# Patient Record
Sex: Female | Born: 1955 | Race: White | Hispanic: No | Marital: Married | Smoking: Never smoker
Health system: Southern US, Community
[De-identification: ages and names within clinical notes are randomized; demographics above are authoritative.]

## PROBLEM LIST (undated history)

## (undated) DIAGNOSIS — N3281 Overactive bladder: Secondary | ICD-10-CM

## (undated) DIAGNOSIS — L570 Actinic keratosis: Secondary | ICD-10-CM

## (undated) DIAGNOSIS — F320 Major depressive disorder, single episode, mild: Secondary | ICD-10-CM

## (undated) DIAGNOSIS — E7800 Pure hypercholesterolemia, unspecified: Secondary | ICD-10-CM

## (undated) DIAGNOSIS — H409 Unspecified glaucoma: Secondary | ICD-10-CM

## (undated) DIAGNOSIS — C4491 Basal cell carcinoma of skin, unspecified: Secondary | ICD-10-CM

## (undated) DIAGNOSIS — M1990 Unspecified osteoarthritis, unspecified site: Secondary | ICD-10-CM

## (undated) DIAGNOSIS — F32A Depression, unspecified: Secondary | ICD-10-CM

## (undated) DIAGNOSIS — E0590 Thyrotoxicosis, unspecified without thyrotoxic crisis or storm: Secondary | ICD-10-CM

## (undated) DIAGNOSIS — Z9289 Personal history of other medical treatment: Secondary | ICD-10-CM

## (undated) DIAGNOSIS — R159 Full incontinence of feces: Secondary | ICD-10-CM

## (undated) HISTORY — PX: TUBAL LIGATION: SHX77

## (undated) HISTORY — DX: Overactive bladder: N32.81

## (undated) HISTORY — DX: Depression, unspecified: F32.A

## (undated) HISTORY — DX: Actinic keratosis: L57.0

## (undated) HISTORY — DX: Unspecified glaucoma: H40.9

## (undated) HISTORY — DX: Full incontinence of feces: R15.9

## (undated) HISTORY — DX: Major depressive disorder, single episode, mild: F32.0

## (undated) HISTORY — DX: Pure hypercholesterolemia, unspecified: E78.00

## (undated) HISTORY — PX: OTHER SURGICAL HISTORY: SHX169

## (undated) HISTORY — DX: Personal history of other medical treatment: Z92.89

## (undated) HISTORY — DX: Basal cell carcinoma of skin, unspecified: C44.91

## (undated) HISTORY — DX: Thyrotoxicosis, unspecified without thyrotoxic crisis or storm: E05.90

## (undated) HISTORY — DX: Unspecified osteoarthritis, unspecified site: M19.90

---

## 1988-08-23 DIAGNOSIS — E0590 Thyrotoxicosis, unspecified without thyrotoxic crisis or storm: Secondary | ICD-10-CM

## 1988-08-23 HISTORY — DX: Thyrotoxicosis, unspecified without thyrotoxic crisis or storm: E05.90

## 2004-10-21 ENCOUNTER — Ambulatory Visit: Payer: Self-pay

## 2005-12-01 ENCOUNTER — Ambulatory Visit: Payer: Self-pay

## 2007-02-07 ENCOUNTER — Ambulatory Visit: Payer: Self-pay

## 2008-02-14 ENCOUNTER — Ambulatory Visit: Payer: Self-pay

## 2008-08-23 HISTORY — PX: COLONOSCOPY: SHX174

## 2009-02-25 ENCOUNTER — Ambulatory Visit: Payer: Self-pay

## 2009-05-23 ENCOUNTER — Ambulatory Visit: Payer: Self-pay | Admitting: Gastroenterology

## 2009-08-23 HISTORY — PX: ESOPHAGOGASTRODUODENOSCOPY: SHX1529

## 2010-02-26 ENCOUNTER — Ambulatory Visit: Payer: Self-pay

## 2011-04-07 ENCOUNTER — Ambulatory Visit: Payer: Self-pay

## 2012-02-04 ENCOUNTER — Emergency Department: Payer: Self-pay | Admitting: Emergency Medicine

## 2012-02-04 LAB — COMPREHENSIVE METABOLIC PANEL
BUN: 17 mg/dL (ref 7–18)
Bilirubin,Total: 0.3 mg/dL (ref 0.2–1.0)
Calcium, Total: 9.1 mg/dL (ref 8.5–10.1)
Chloride: 106 mmol/L (ref 98–107)
Co2: 31 mmol/L (ref 21–32)
EGFR (Non-African Amer.): >60
Glucose: 115 mg/dL — ABNORMAL HIGH (ref 65–99)
Osmolality: 287 (ref 275–301)
Potassium: 3.7 mmol/L (ref 3.5–5.1)
SGOT(AST): 29 U/L (ref 15–37)
SGPT (ALT): 21 U/L
Sodium: 143 mmol/L (ref 136–145)
Total Protein: 6.9 g/dL (ref 6.4–8.2)

## 2012-02-04 LAB — CBC
HCT: 36.4 % (ref 35.0–47.0)
MCH: 30.7 pg (ref 26.0–34.0)
MCHC: 33.8 g/dL (ref 32.0–36.0)
MCV: 91 fL (ref 80–100)
Platelet: 198 10*3/uL (ref 150–440)
WBC: 5.6 10*3/uL (ref 3.6–11.0)

## 2012-02-04 LAB — PROTIME-INR: Prothrombin Time: 12.7 secs (ref 11.5–14.7)

## 2012-02-04 LAB — APTT: Activated PTT: 27 secs (ref 23.6–35.9)

## 2012-02-04 IMAGING — CR DG CHEST 2V
1 series · 2 of 2 positions shown · non-contrast
Comparison: none

REASON FOR EXAM: cp
COMMENTS:   May transport without cardiac monitor

[Series 1: pa · 0.17mm/px · 2 of 2 slices shown]
[im 1/2]
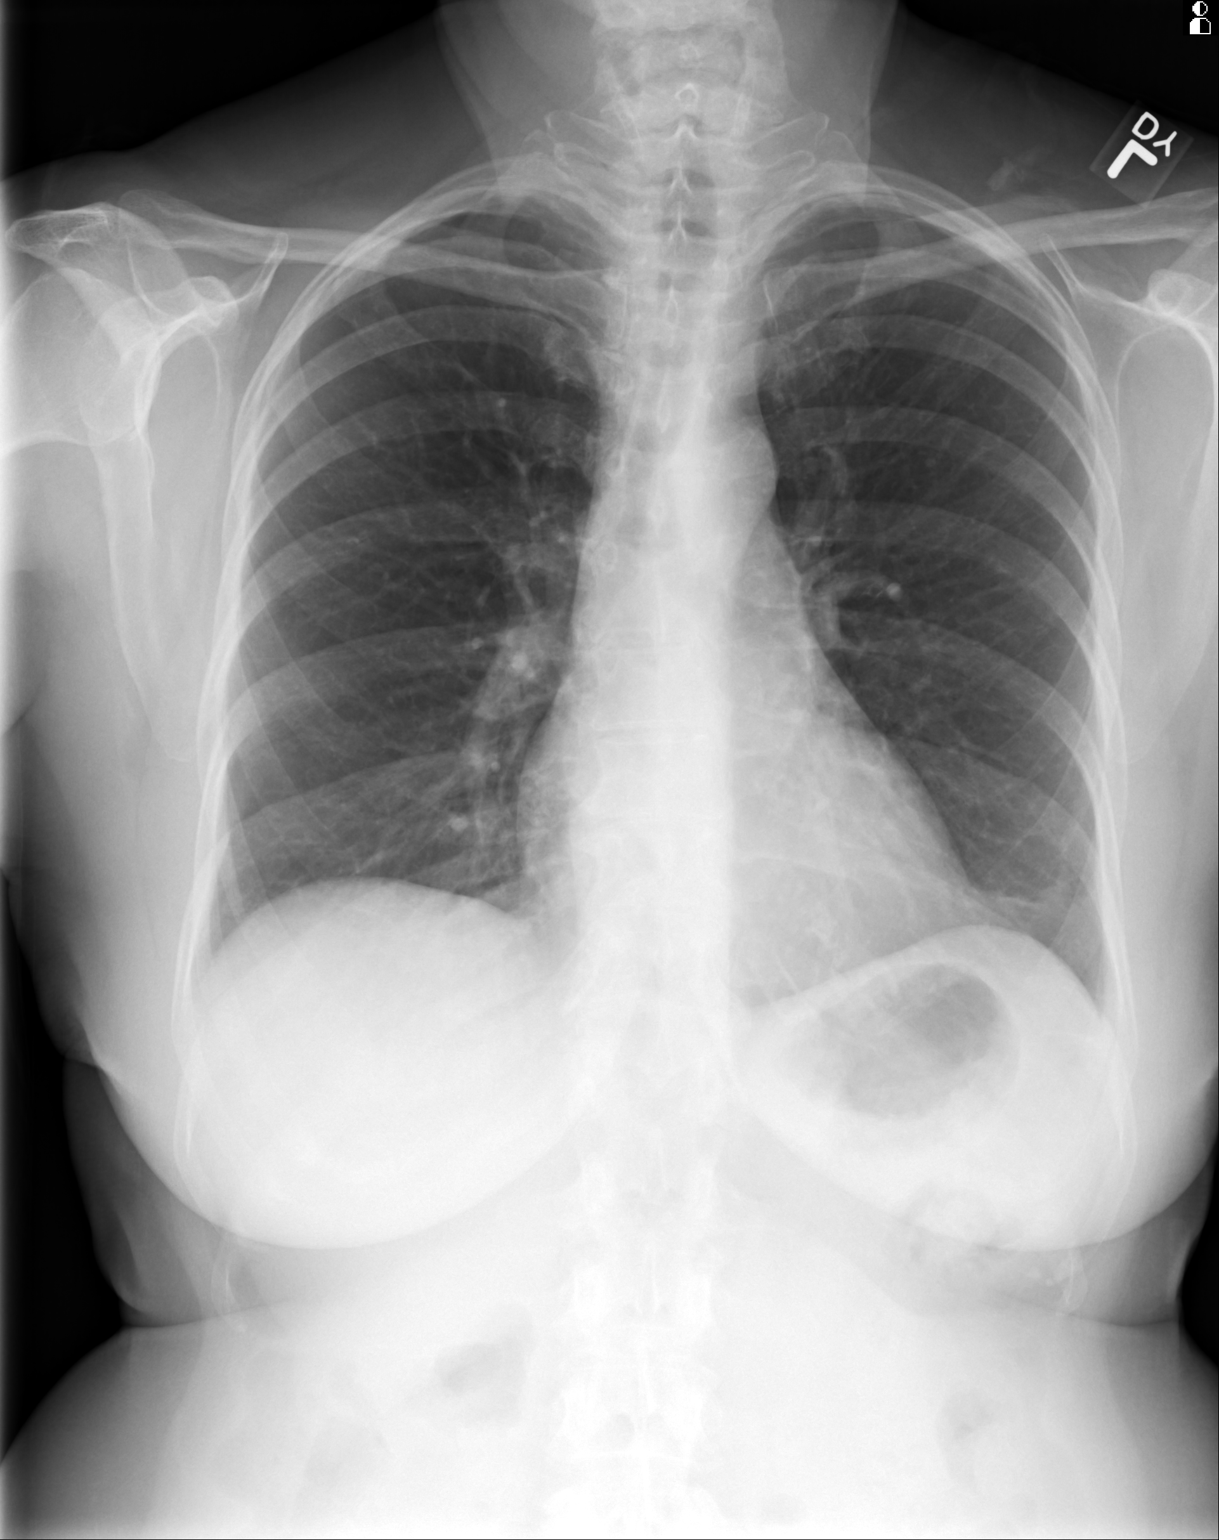
[im 2/2]
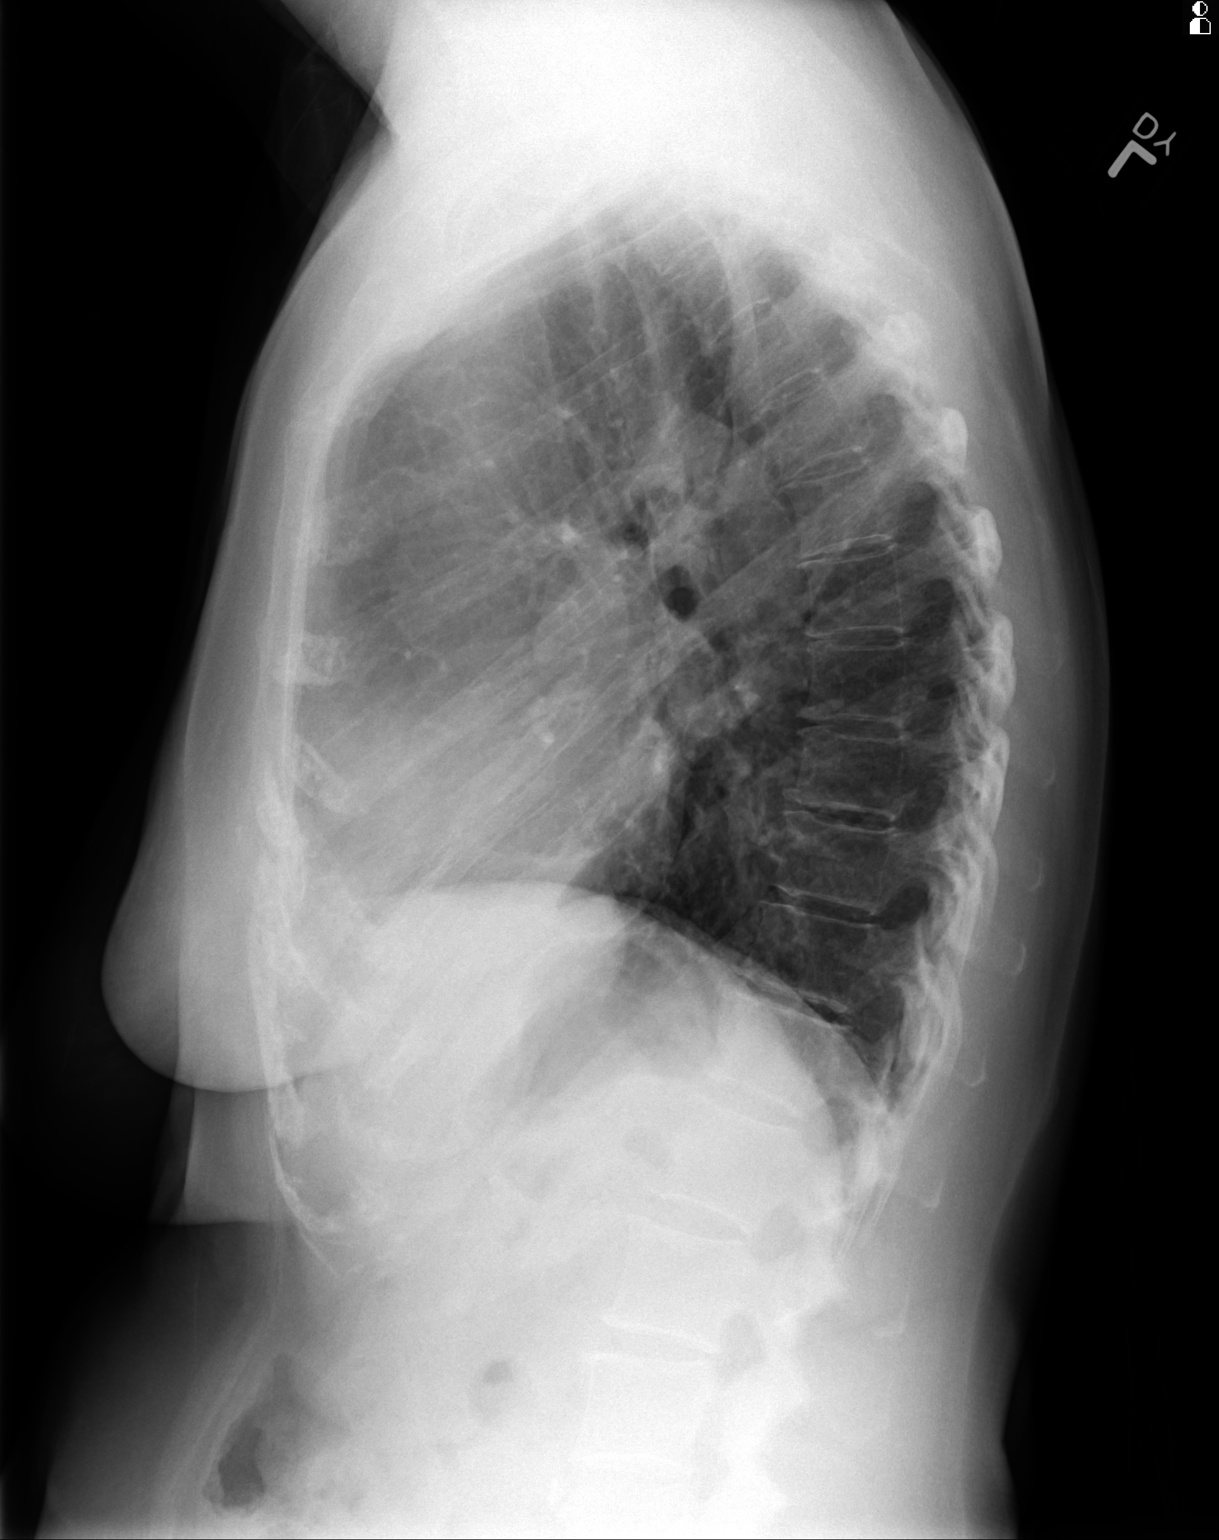

[2 of 2 positions shown; findings below may reference images not displayed]

PROCEDURE:     DXR - DXR CHEST PA (OR AP) AND LATERAL  - [DATE]  [DATE]

RESULT:     The lungs are well-expanded and clear. There is no focal
infiltrate. The cardiac silhouette is normal in size. The mediastinum is
normal in width. There is no pulmonary vascular congestion or pleural
effusion. The trachea is midline. The bony thorax exhibits no acute
abnormality.
IMPRESSION: I do not see evidence of acute cardiopulmonary abnormality.

[REDACTED]

## 2012-02-21 ENCOUNTER — Ambulatory Visit: Payer: Self-pay | Admitting: Internal Medicine

## 2012-04-10 ENCOUNTER — Ambulatory Visit: Payer: Self-pay

## 2013-04-11 ENCOUNTER — Ambulatory Visit: Payer: Self-pay | Admitting: Obstetrics and Gynecology

## 2013-04-11 IMAGING — MG MM CAD SCREENING MAMMO
1 series · 4 of 4 positions shown · non-contrast
Comparison: Previous exam(s).

REASON FOR EXAM: SCR MAMMO NO ORDER
COMMENTS:

PROCEDURE:     MAM - MAM DGTL SCRN MAM NO ORDER W/CAD  - [DATE]  [DATE]
CLINICAL DATA: Screening.
DIGITAL SCREENING BILATERAL MAMMOGRAM WITH CAD

[R CC · right · 4 of 4 slices shown]
[im 1/4]
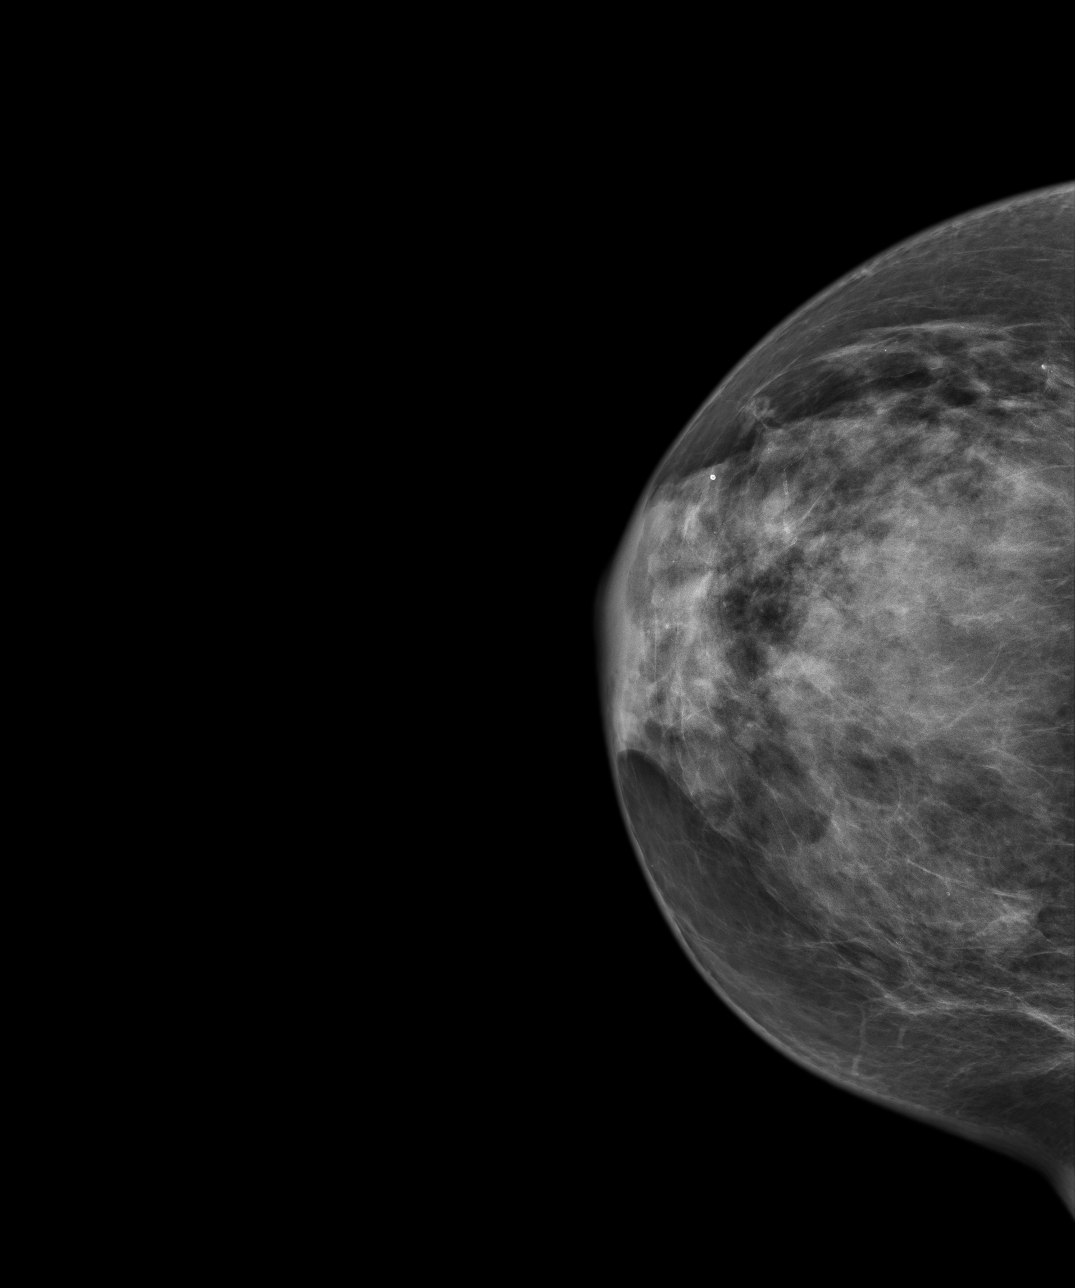
[im 2/4]
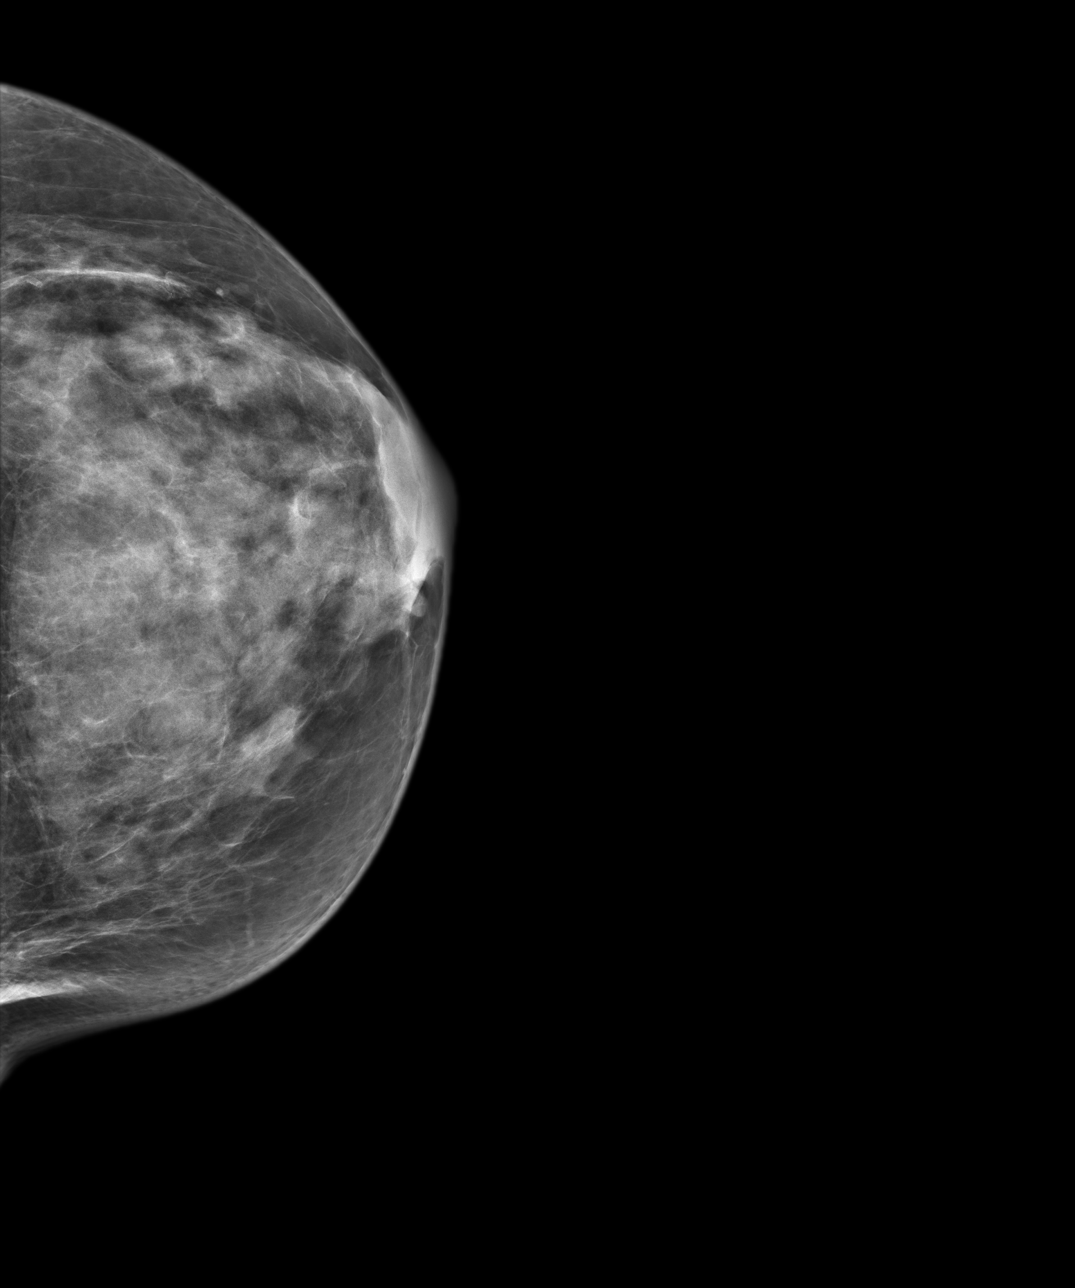
[im 3/4]
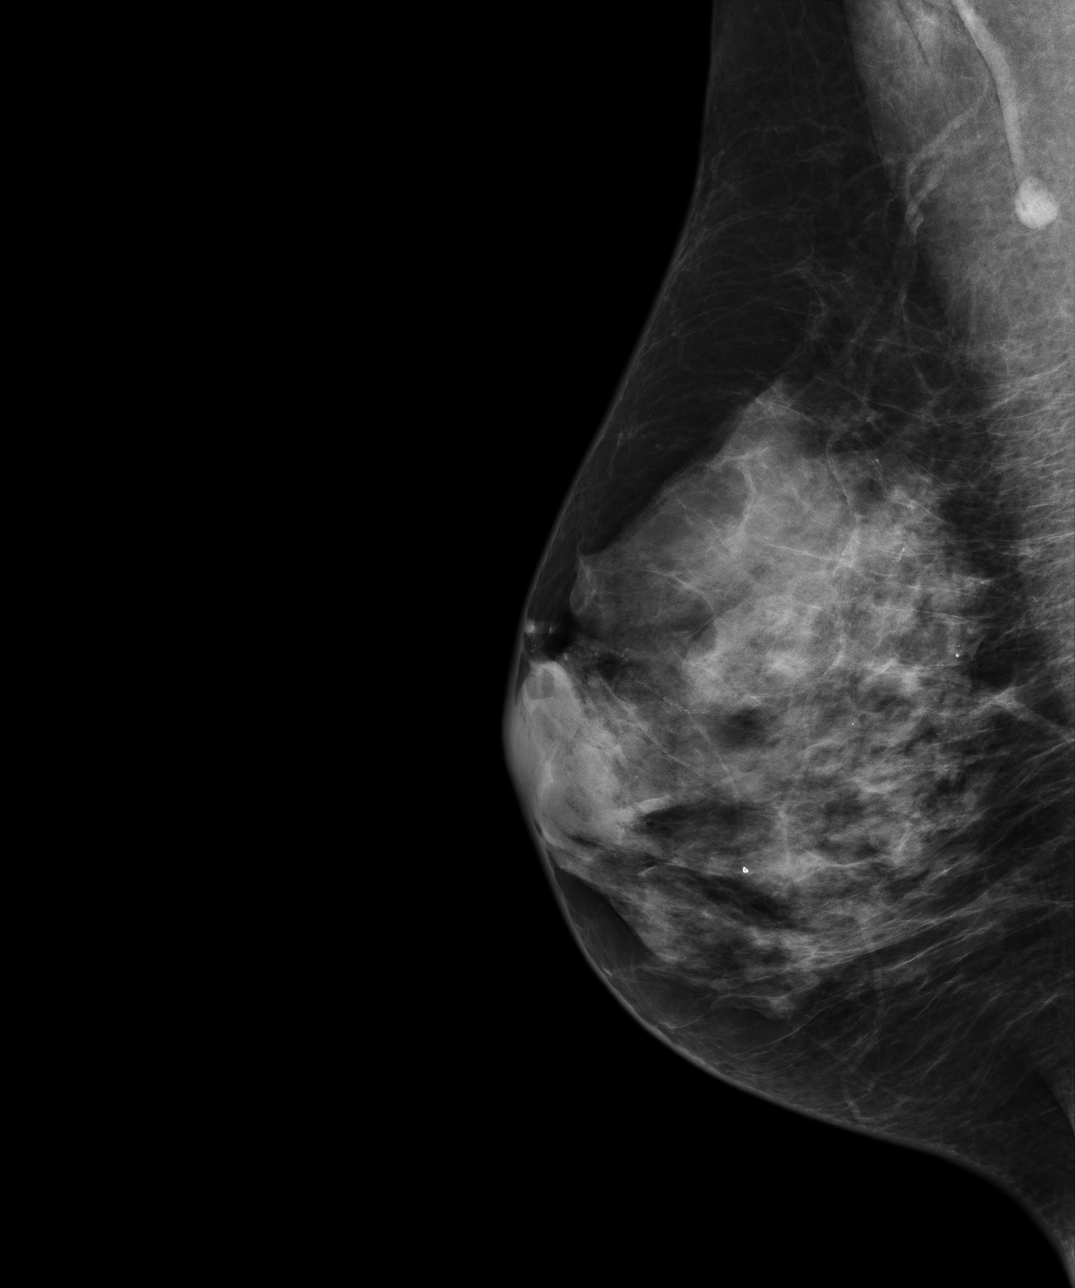
[im 4/4]
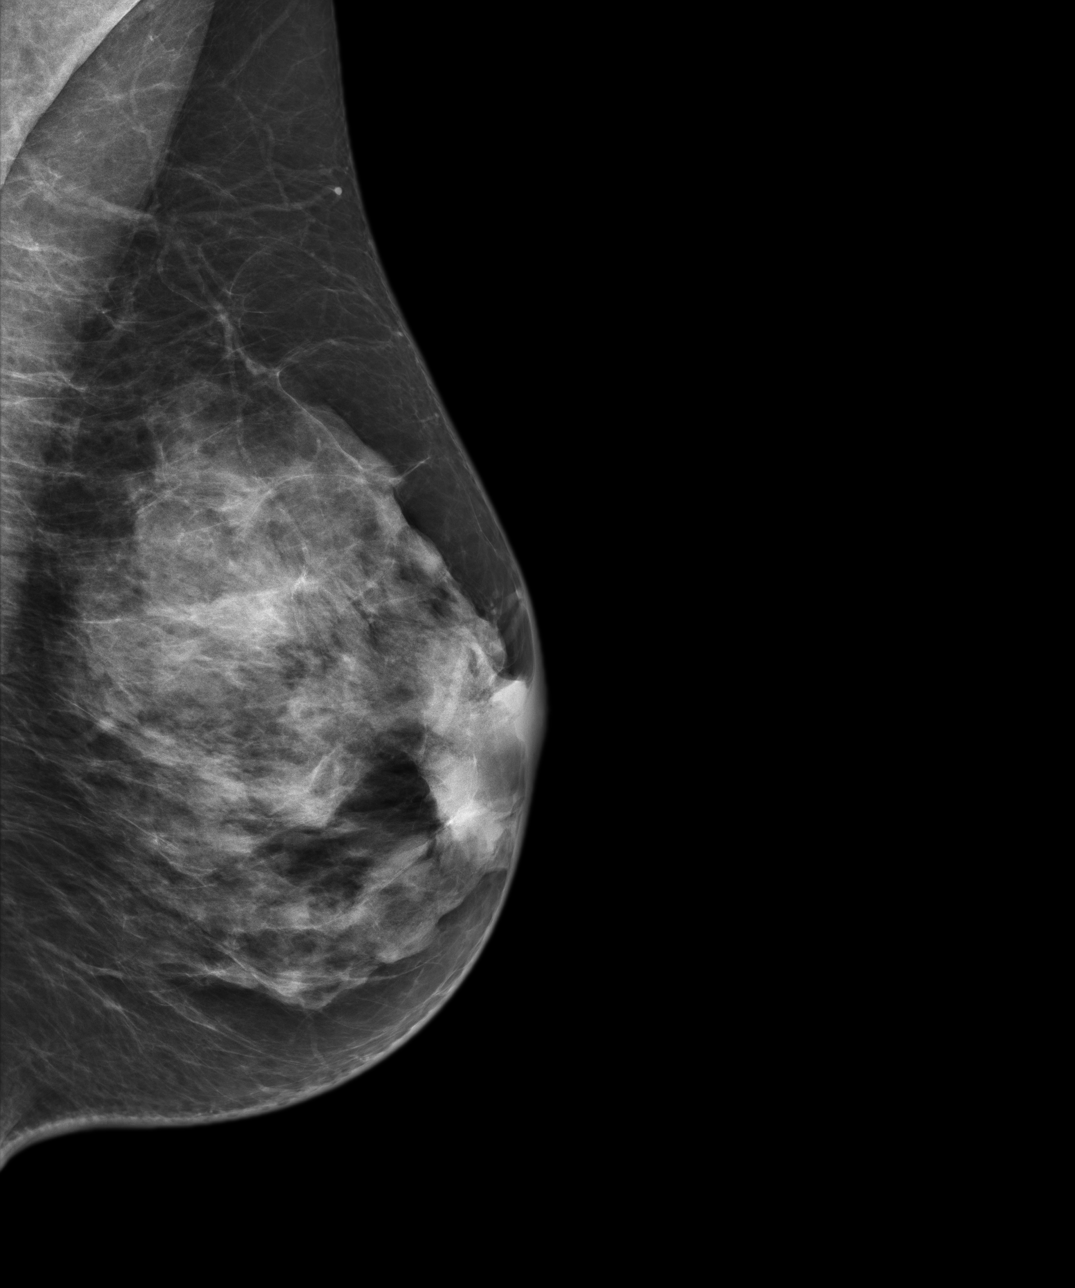

[4 of 4 positions shown; findings below may reference images not displayed]

FINDINGS: ACR Breast Density Category d:  The breast tissue is extremely
dense, which lowers the sensitivity of mammography.

There are no findings suspicious for malignancy.
IMPRESSION: No mammographic evidence of malignancy.

A result letter of this screening mammogram will be mailed directly
to the patient.

RECOMMENDATION:
Screening mammogram in one year. (Code:[E7])

BI-RADS CATEGORY 1:  Negative.

## 2013-08-23 HISTORY — PX: CYSTOSCOPY: SUR368

## 2014-01-31 DIAGNOSIS — M47819 Spondylosis without myelopathy or radiculopathy, site unspecified: Secondary | ICD-10-CM | POA: Insufficient documentation

## 2014-02-04 DIAGNOSIS — R32 Unspecified urinary incontinence: Secondary | ICD-10-CM | POA: Insufficient documentation

## 2014-04-15 ENCOUNTER — Ambulatory Visit: Payer: Self-pay

## 2014-04-15 IMAGING — MG MM DIGITAL SCREENING BILAT W/ CAD
1 series · 4 of 4 positions shown · non-contrast
Comparison: Previous exam(s).

CLINICAL DATA: Screening.

EXAM:
DIGITAL SCREENING BILATERAL MAMMOGRAM WITH CAD

[R CC · right · 4 of 4 slices shown]
[im 1/4]
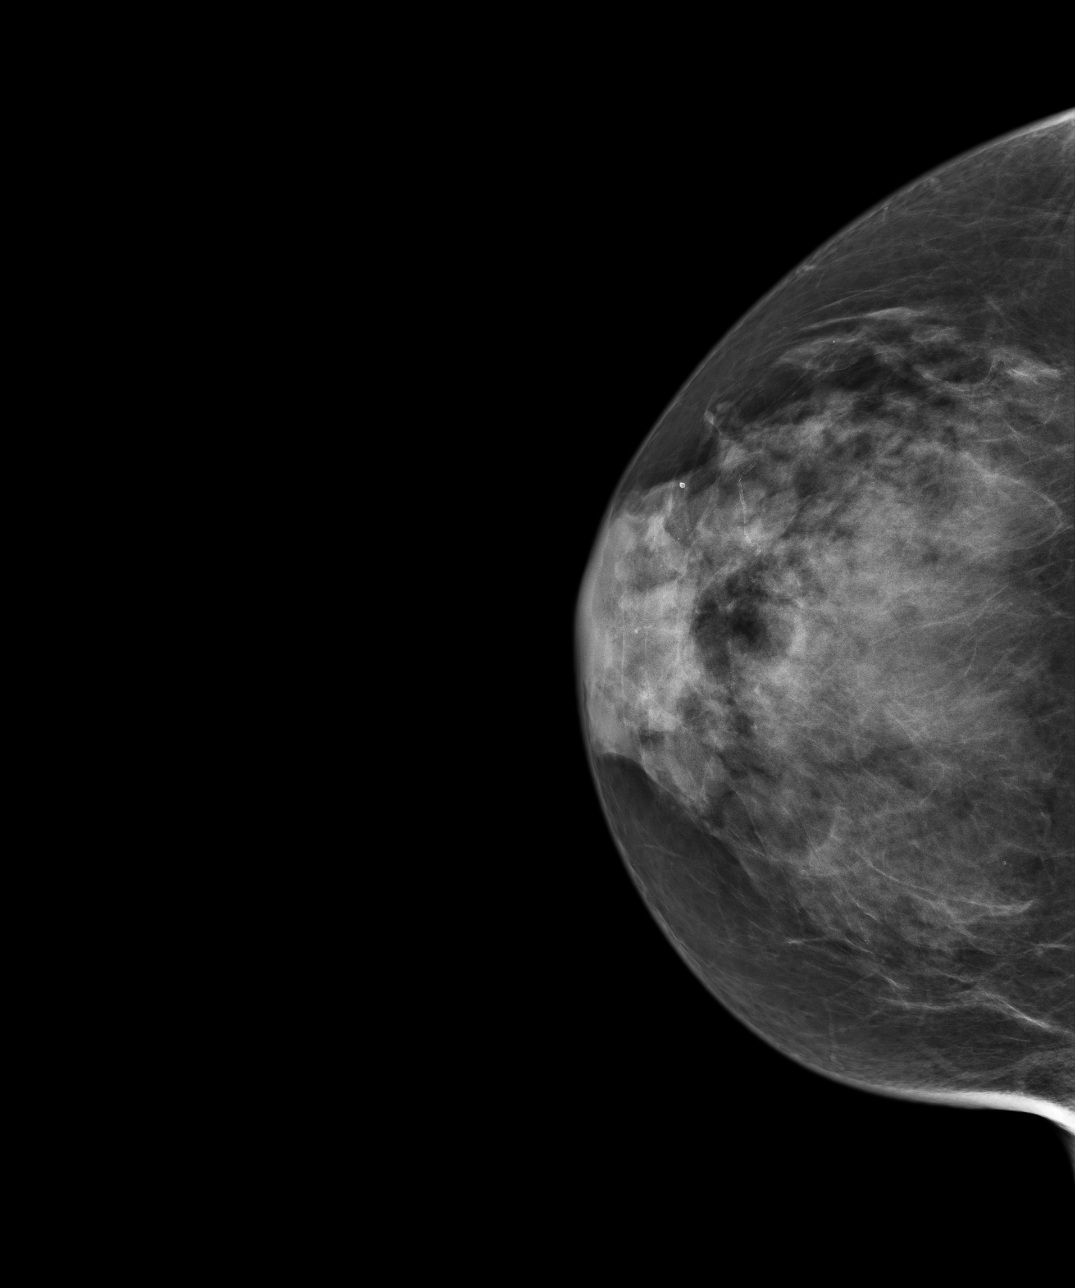
[im 2/4]
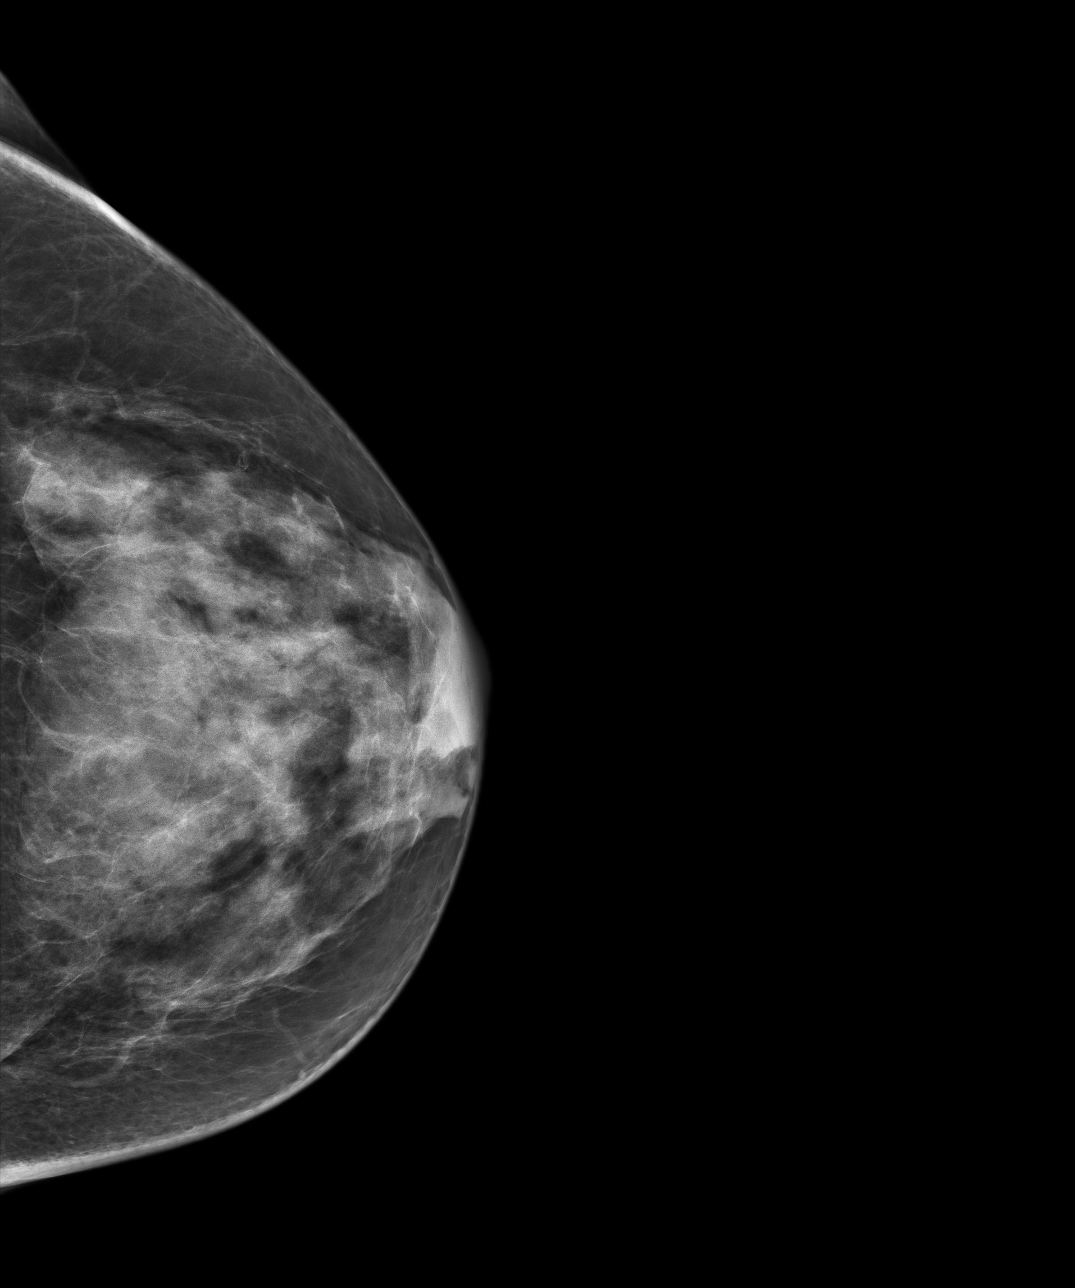
[im 3/4]
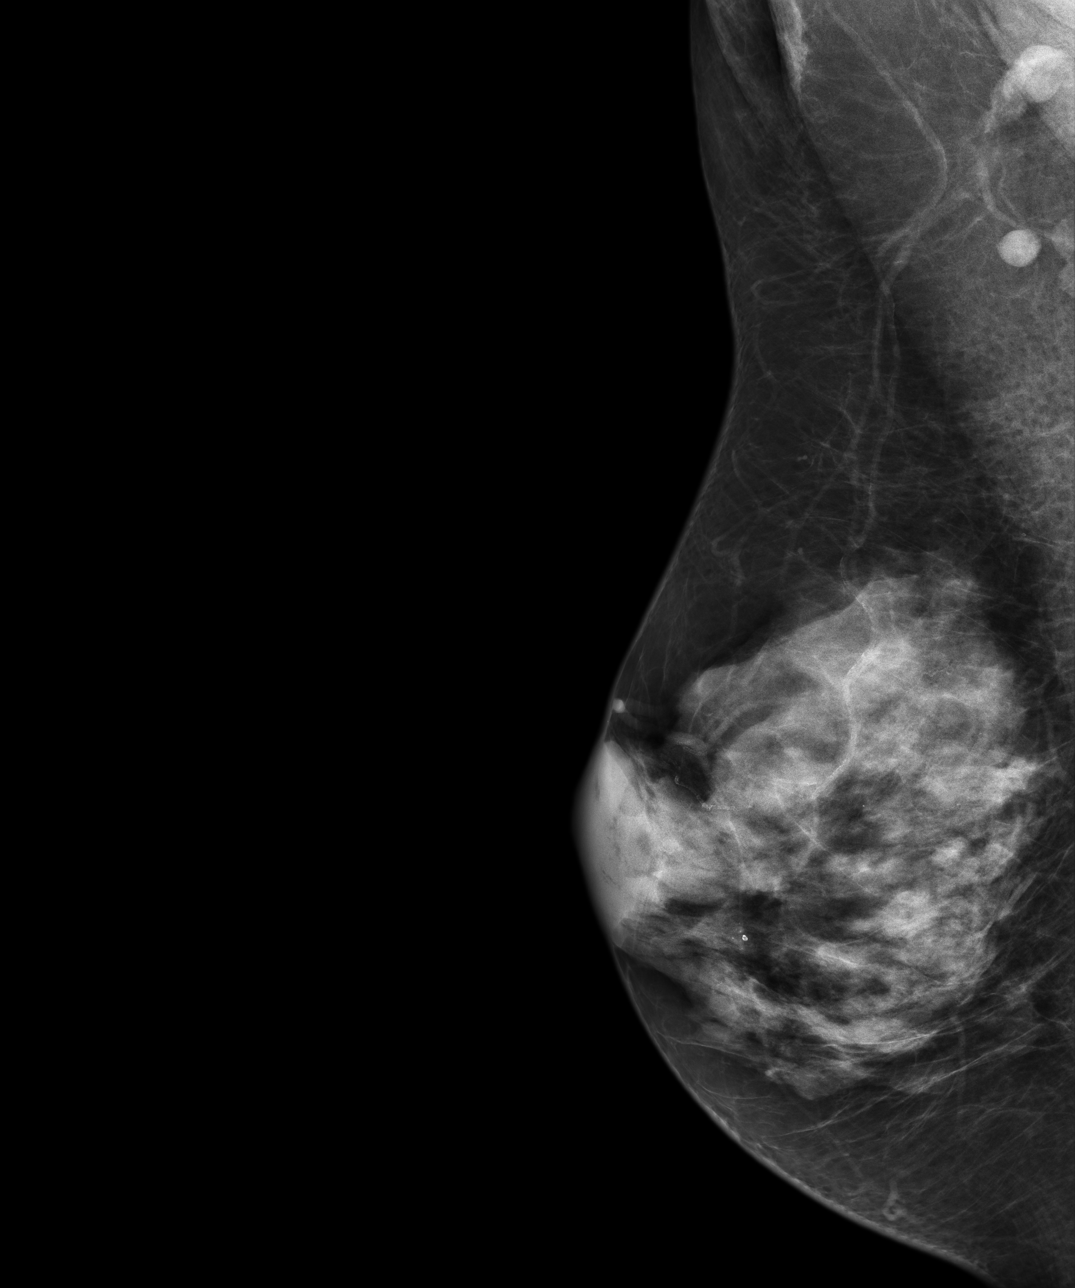
[im 4/4]
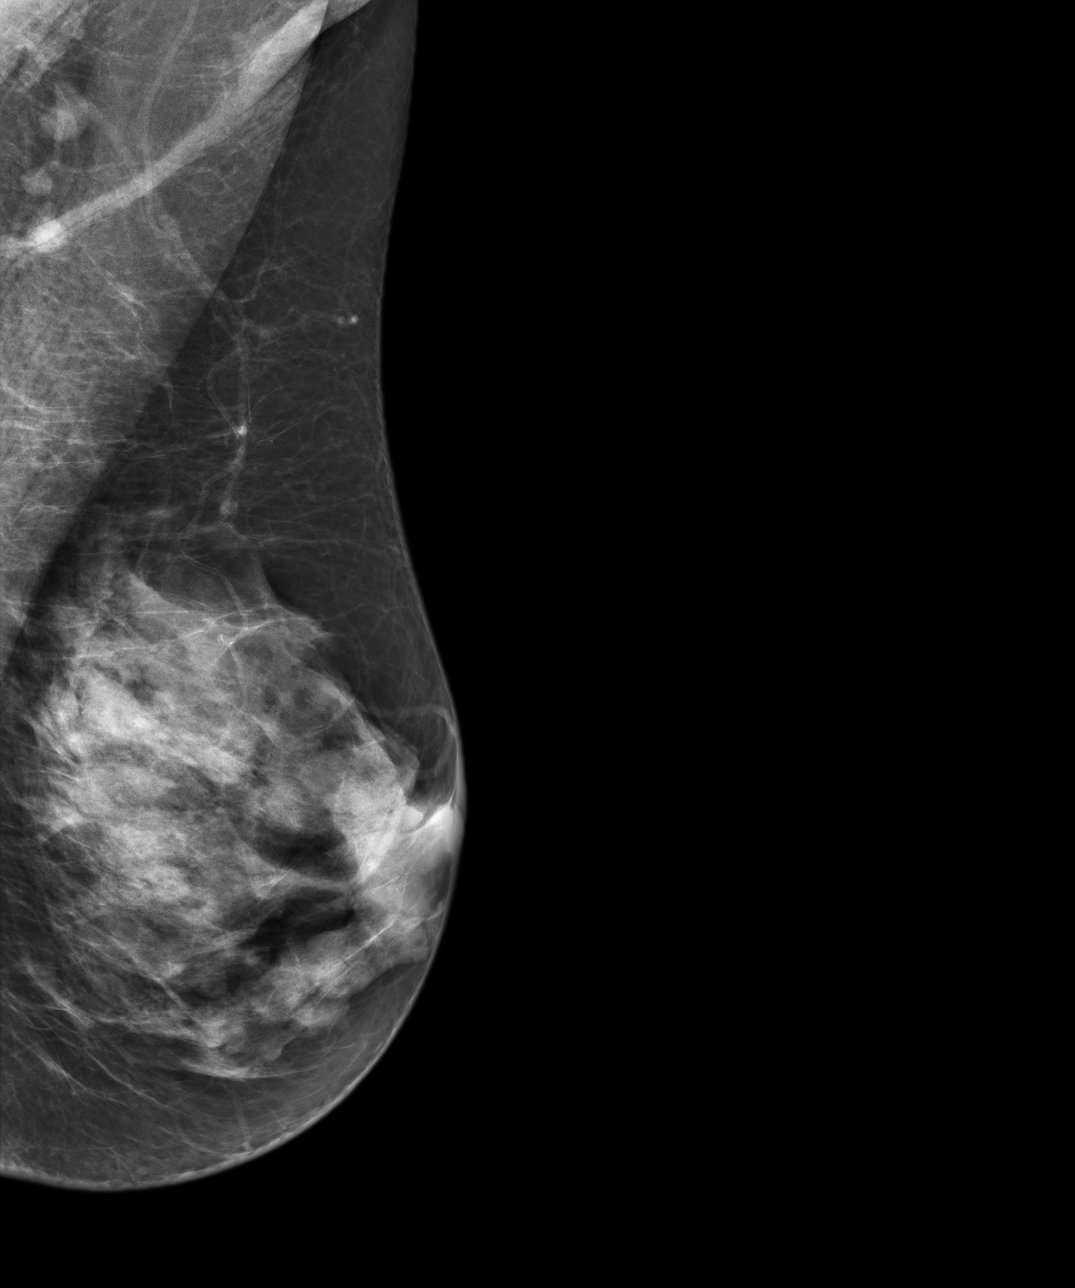

[4 of 4 positions shown; findings below may reference images not displayed]

ACR Breast Density Category d: The breast tissue is extremely dense,
which lowers the sensitivity of mammography.
FINDINGS: There are no findings suspicious for malignancy. Images were
processed with CAD.
IMPRESSION: No mammographic evidence of malignancy. A result letter of this
screening mammogram will be mailed directly to the patient.

RECOMMENDATION:
Screening mammogram in one year. (Code:[ZH])

BI-RADS CATEGORY  1: Negative.

## 2015-03-18 ENCOUNTER — Other Ambulatory Visit: Payer: Self-pay | Admitting: Certified Nurse Midwife

## 2015-03-18 DIAGNOSIS — Z1231 Encounter for screening mammogram for malignant neoplasm of breast: Secondary | ICD-10-CM

## 2015-04-18 ENCOUNTER — Ambulatory Visit
Admission: RE | Admit: 2015-04-18 | Discharge: 2015-04-18 | Disposition: A | Discharge status: Home / Self Care | Payer: BLUE CROSS/BLUE SHIELD | Source: Ambulatory Visit | Attending: Certified Nurse Midwife | Admitting: Certified Nurse Midwife

## 2015-04-18 DIAGNOSIS — Z1231 Encounter for screening mammogram for malignant neoplasm of breast: Secondary | ICD-10-CM | POA: Insufficient documentation

## 2015-04-18 IMAGING — MG MM DIGITAL SCREENING BILAT W/ CAD
4 series · 4 of 4 positions shown · non-contrast
Comparison: Previous exam(s).

CLINICAL DATA: Screening.

EXAM:
DIGITAL SCREENING BILATERAL MAMMOGRAM WITH CAD

[R MLO]
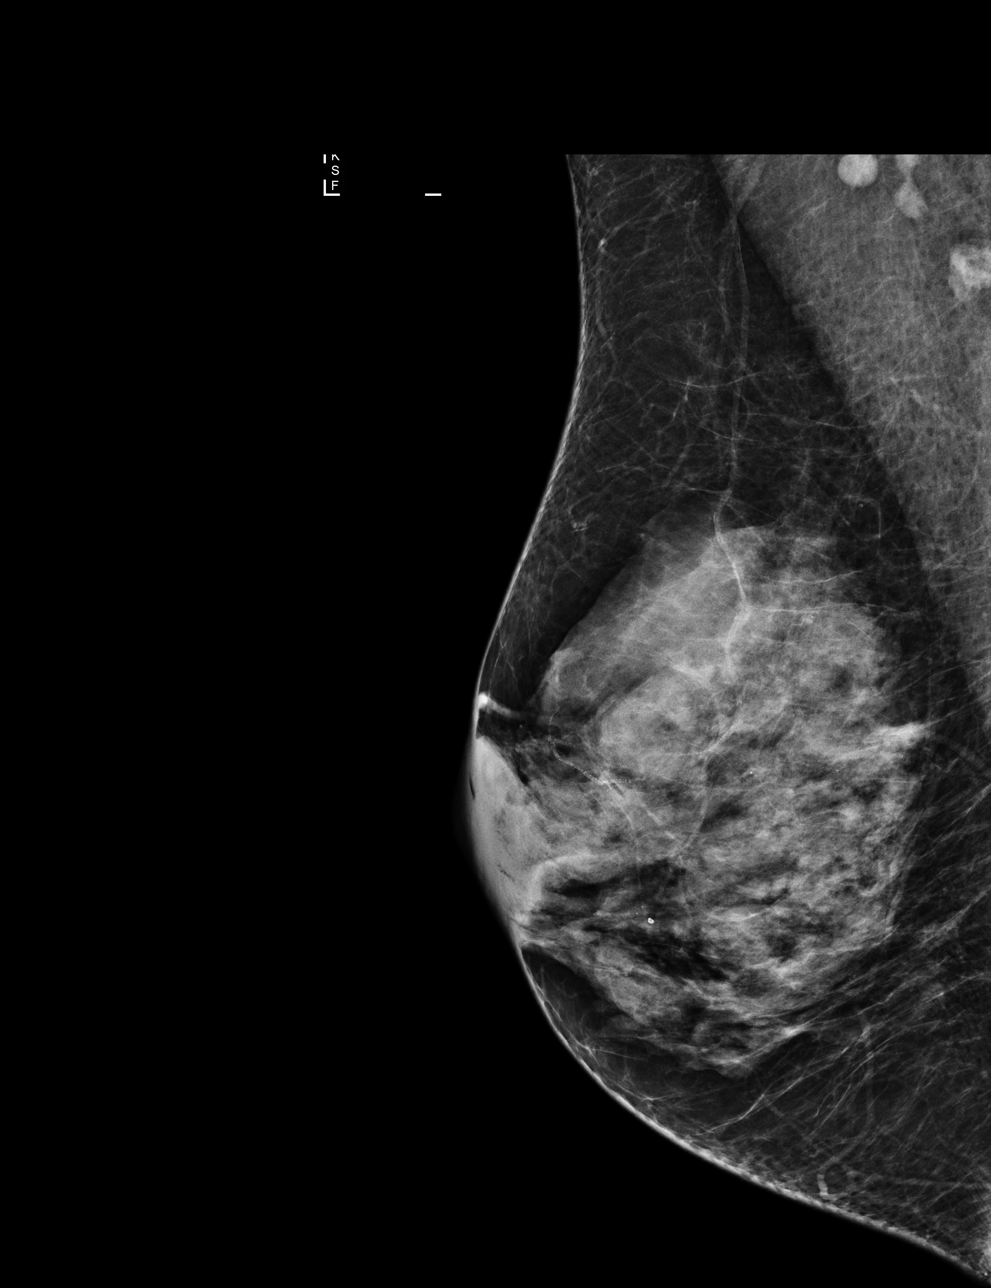

[L MLO]
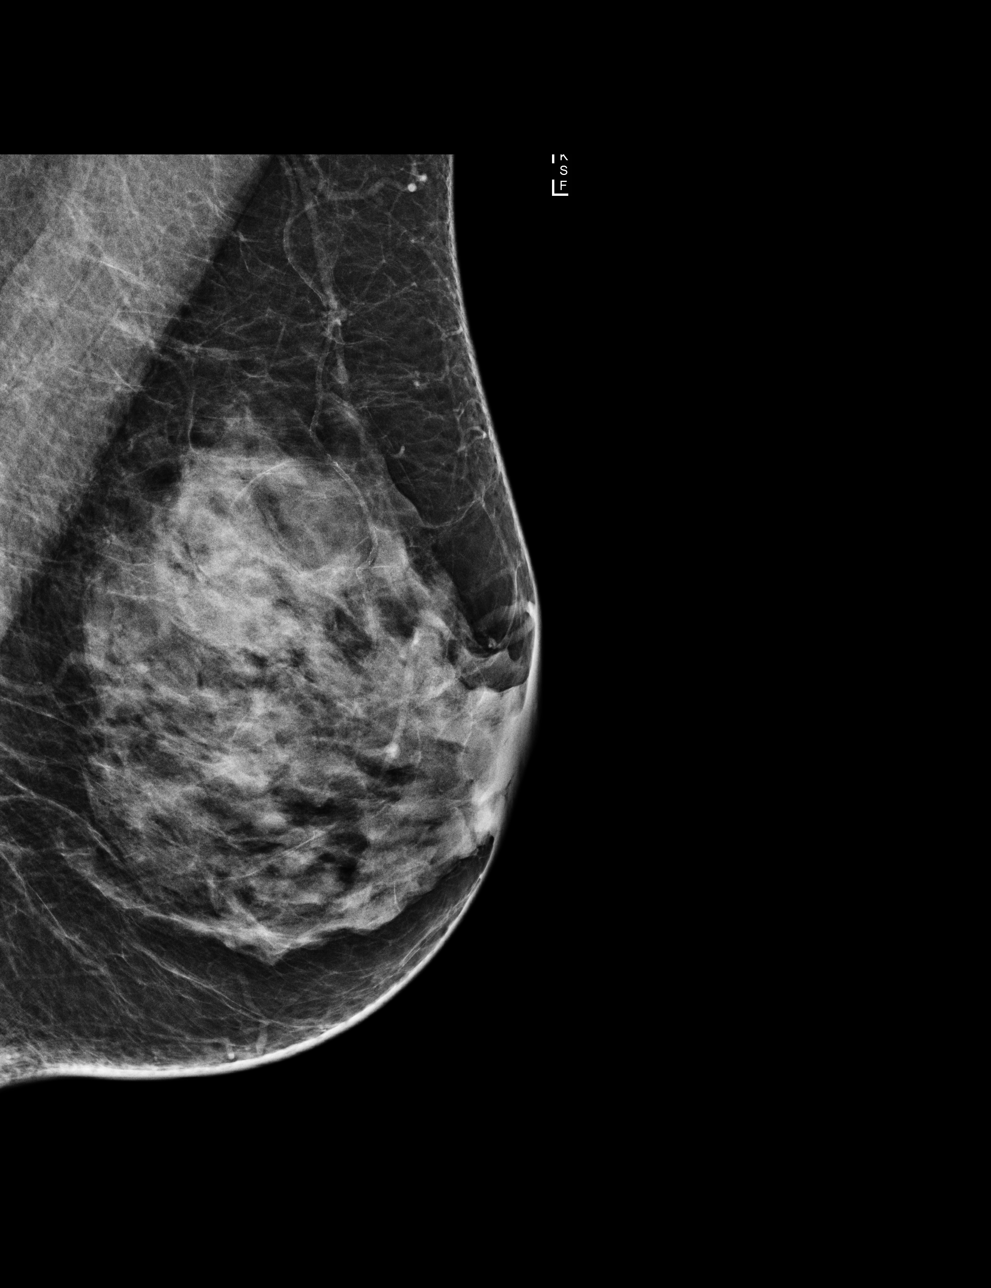

[R CC]
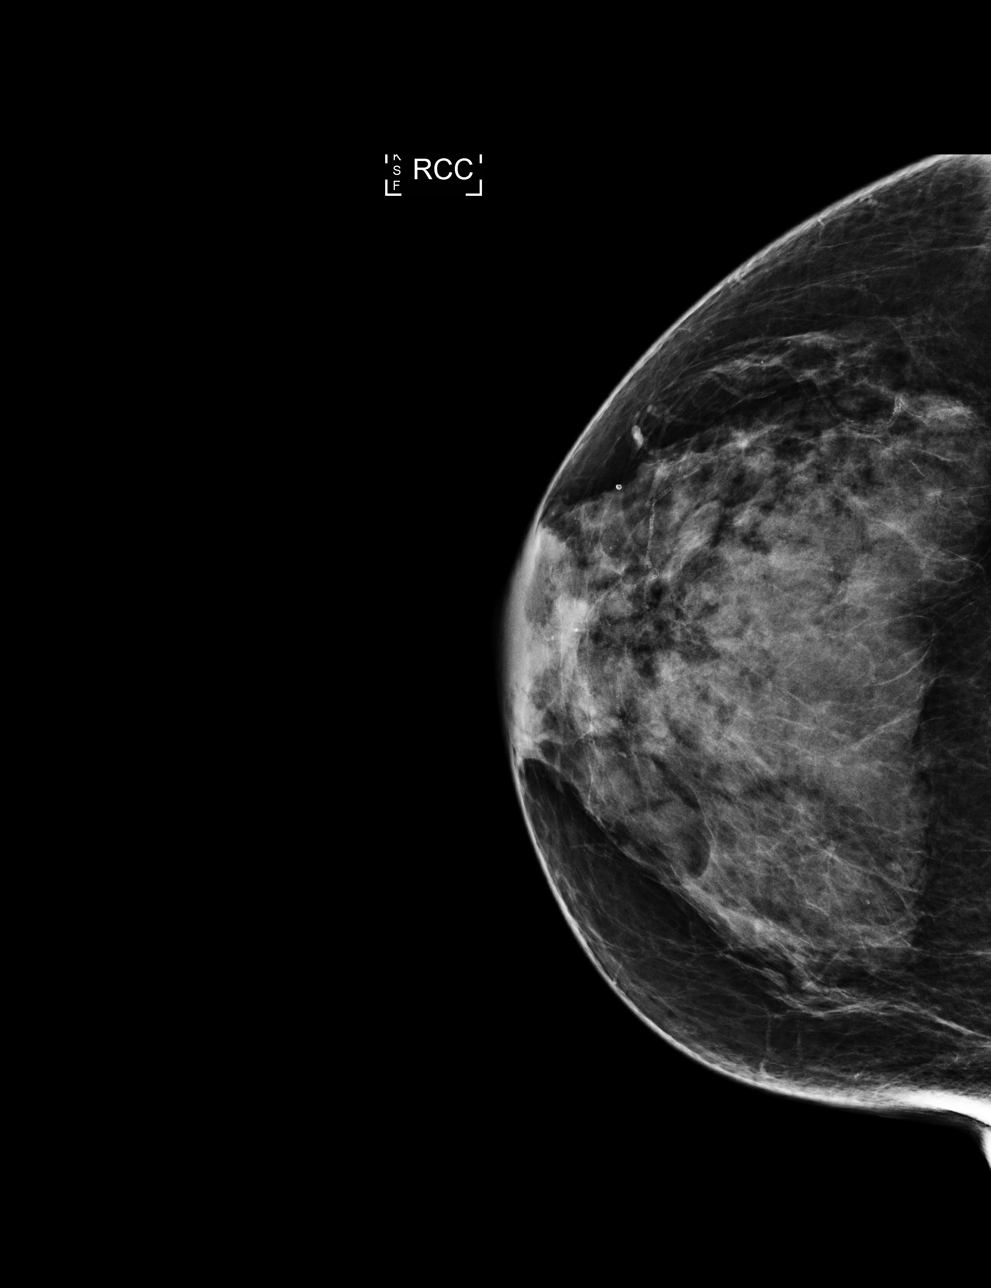

[L CC]
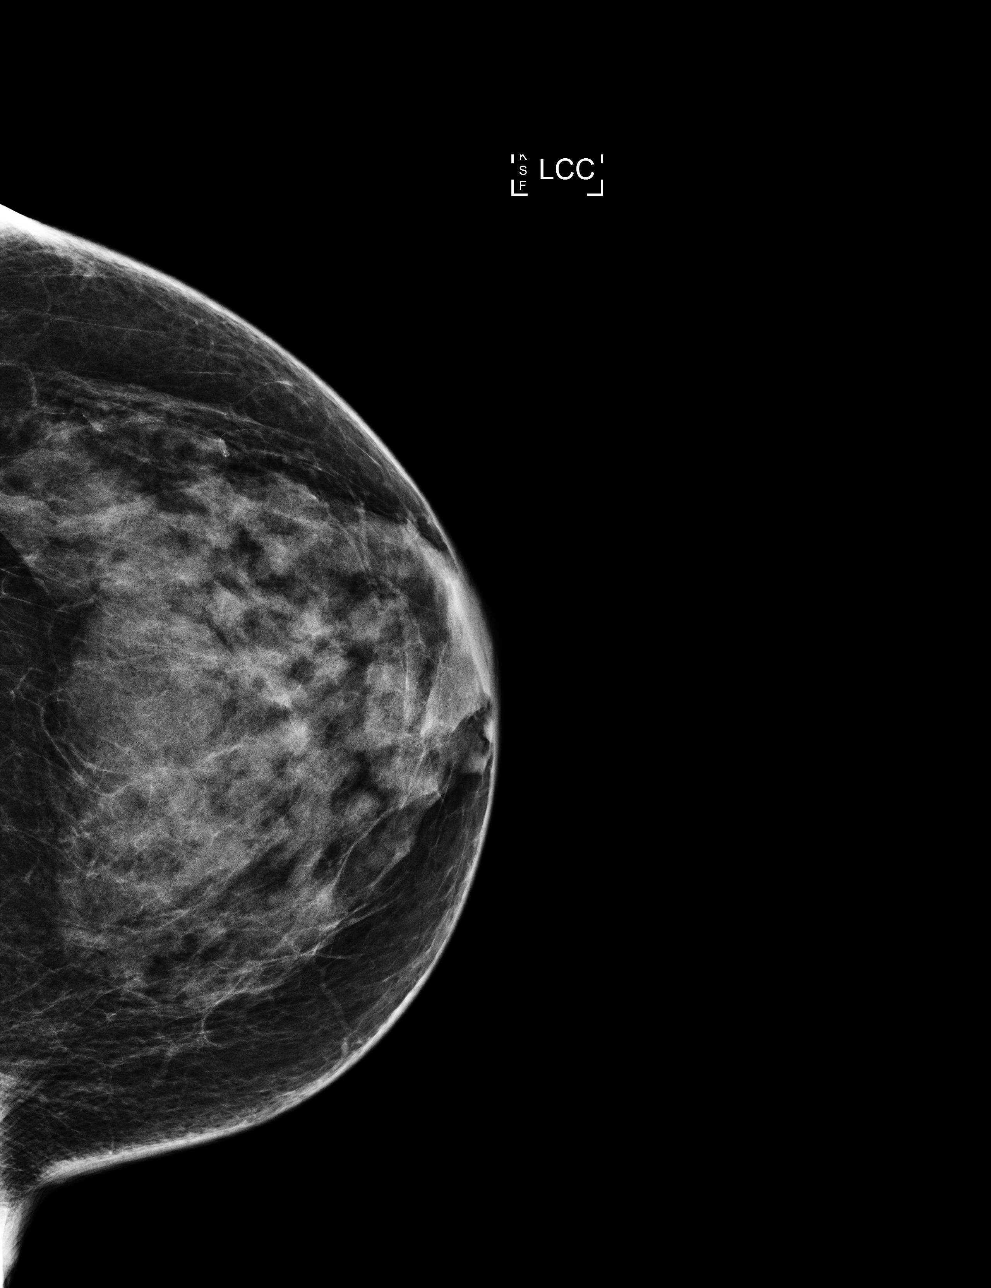

[4 of 4 positions shown; findings below may reference images not displayed]

ACR Breast Density Category d: The breast tissue is extremely dense,
which lowers the sensitivity of mammography.
FINDINGS: There are no findings suspicious for malignancy. Images were
processed with CAD.
IMPRESSION: No mammographic evidence of malignancy. A result letter of this
screening mammogram will be mailed directly to the patient.

RECOMMENDATION:
Screening mammogram in one year. (Code:[ZH])

BI-RADS CATEGORY  1: Negative.

## 2015-06-06 DIAGNOSIS — R351 Nocturia: Secondary | ICD-10-CM | POA: Insufficient documentation

## 2015-07-16 DIAGNOSIS — Z8679 Personal history of other diseases of the circulatory system: Secondary | ICD-10-CM | POA: Insufficient documentation

## 2016-02-04 LAB — HM PAP SMEAR: HM PAP: NEGATIVE

## 2016-02-23 ENCOUNTER — Other Ambulatory Visit: Payer: Self-pay | Admitting: Certified Nurse Midwife

## 2016-02-23 DIAGNOSIS — Z1231 Encounter for screening mammogram for malignant neoplasm of breast: Secondary | ICD-10-CM

## 2016-02-27 DIAGNOSIS — R159 Full incontinence of feces: Secondary | ICD-10-CM | POA: Insufficient documentation

## 2016-04-30 ENCOUNTER — Ambulatory Visit: Admission: RE | Admit: 2016-04-30 | Payer: BLUE CROSS/BLUE SHIELD | Source: Ambulatory Visit

## 2016-05-14 ENCOUNTER — Other Ambulatory Visit: Payer: Self-pay | Admitting: Certified Nurse Midwife

## 2016-05-14 ENCOUNTER — Ambulatory Visit
Admission: RE | Admit: 2016-05-14 | Discharge: 2016-05-14 | Disposition: A | Discharge status: Home / Self Care | Payer: BLUE CROSS/BLUE SHIELD | Source: Ambulatory Visit | Attending: Certified Nurse Midwife | Admitting: Certified Nurse Midwife

## 2016-05-14 DIAGNOSIS — Z1231 Encounter for screening mammogram for malignant neoplasm of breast: Secondary | ICD-10-CM

## 2016-05-14 IMAGING — MG MM DIGITAL SCREENING BILAT W/ TOMO W/ CAD
9 of 12 series · 9 of 28 positions shown · non-contrast
Comparison: Previous exam(s).

CLINICAL DATA: Screening.

EXAM:
2D DIGITAL SCREENING BILATERAL MAMMOGRAM WITH CAD AND ADJUNCT TOMO

[R CC]
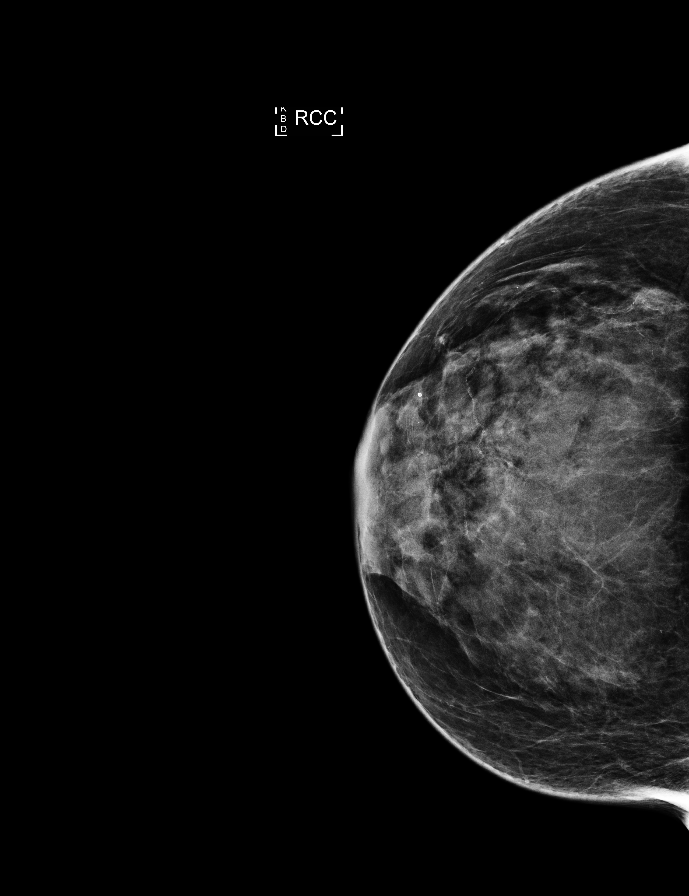

[L MLO]
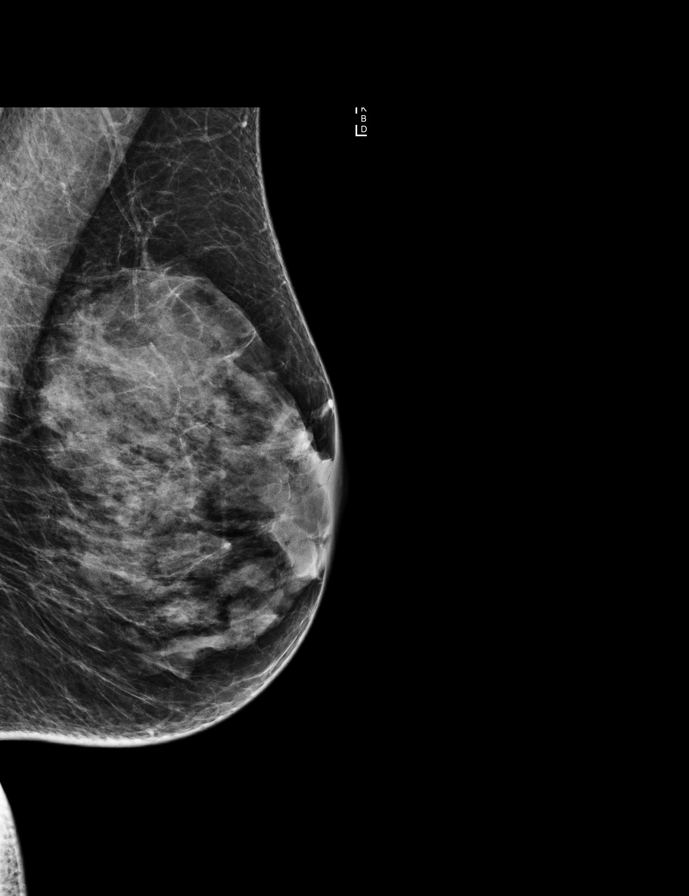

[L MLO synth-2D]
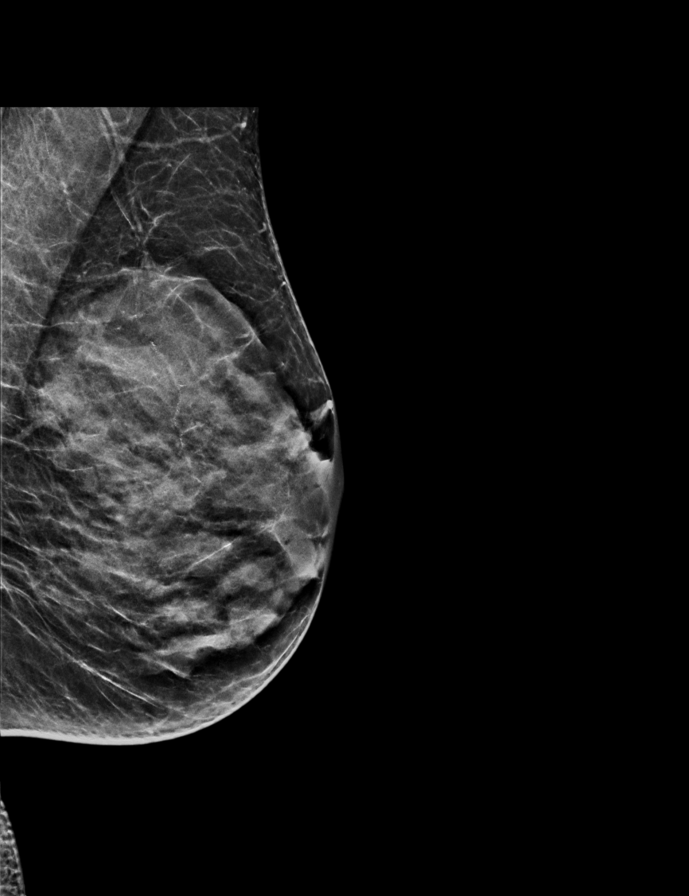

[R MLO]
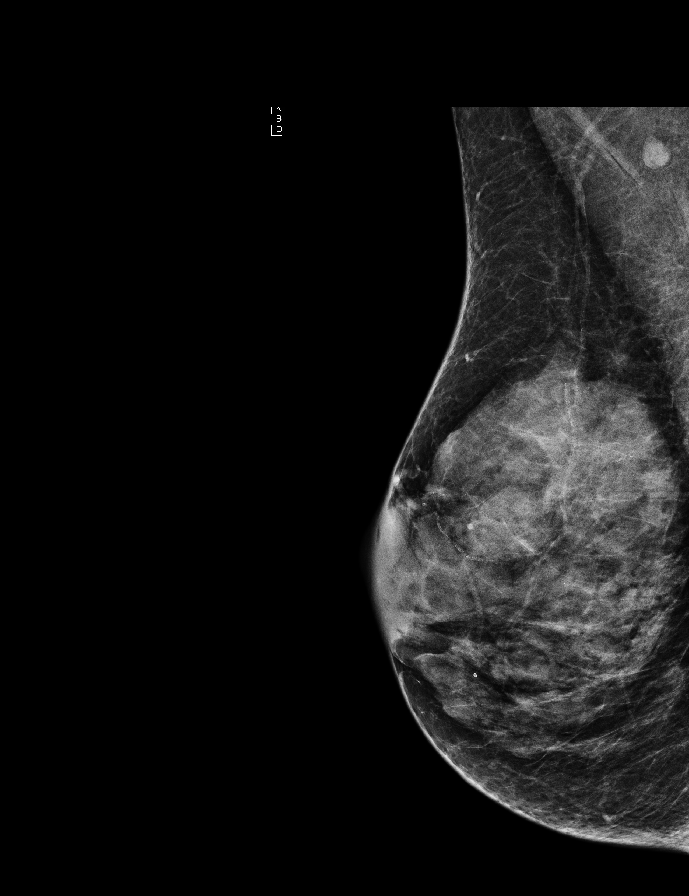

[L CC synth-2D]
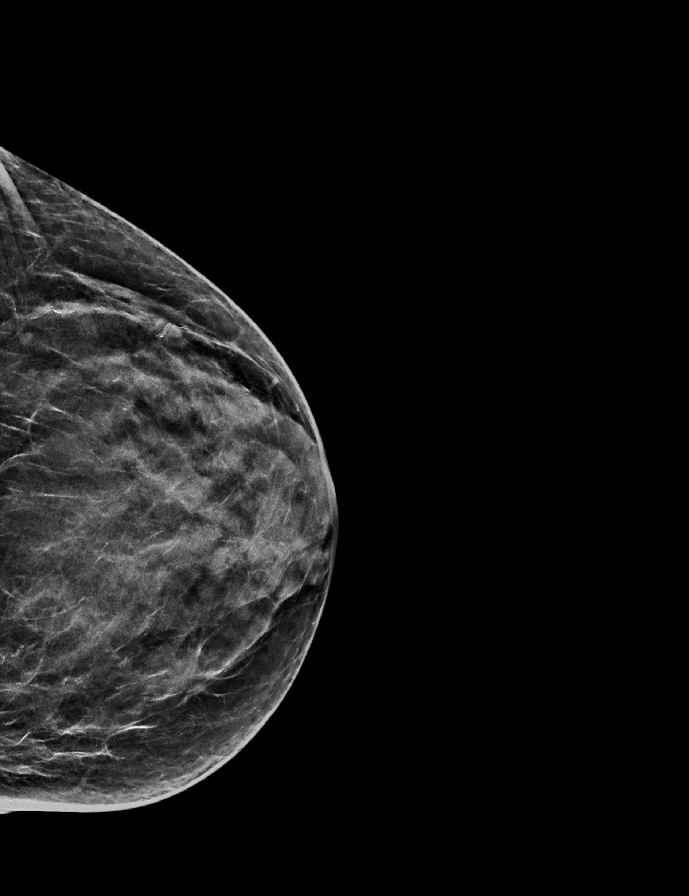

[R MLO synth-2D]
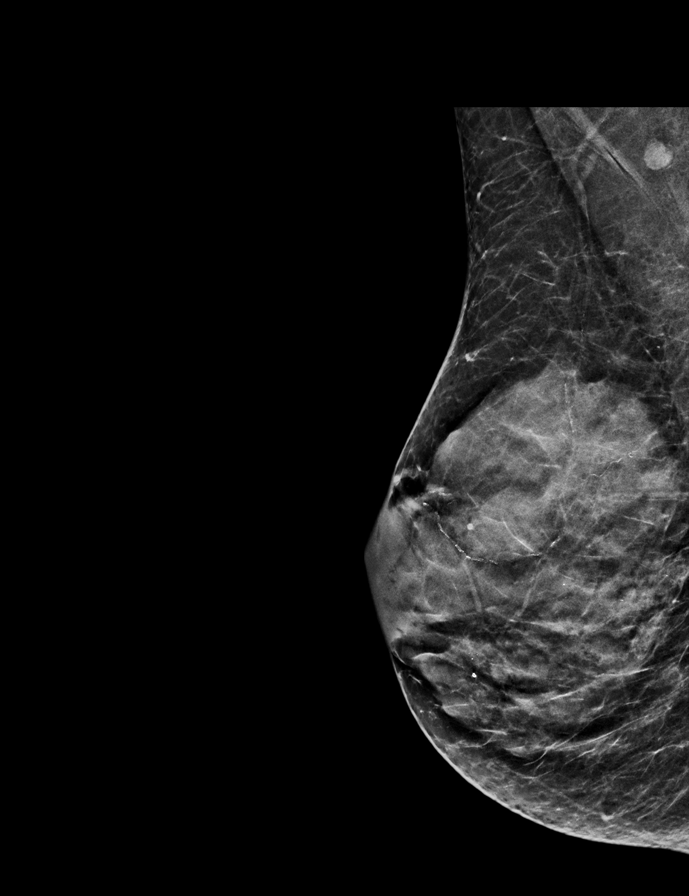

[L CC]
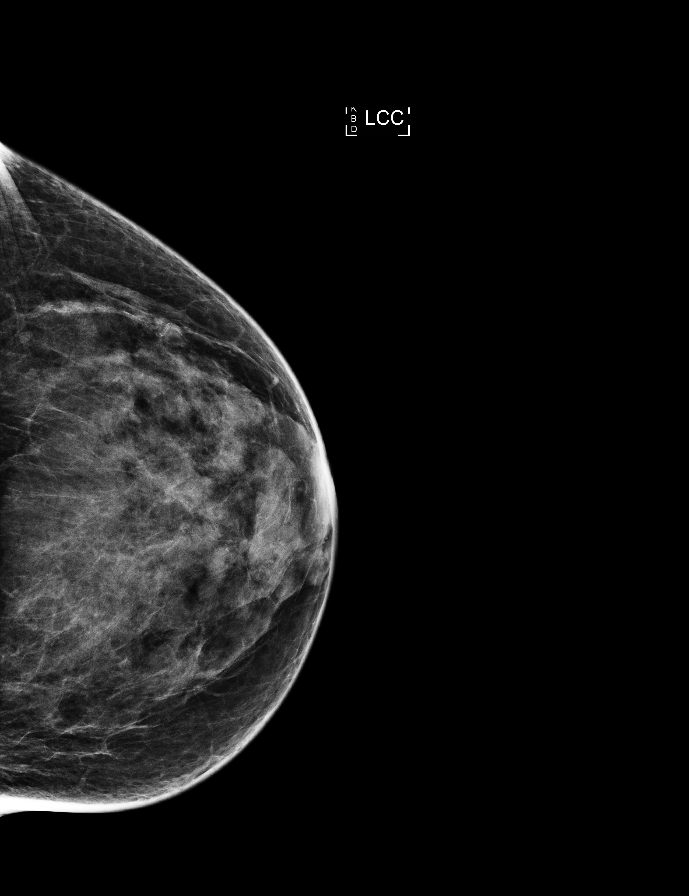

[R CC synth-2D]
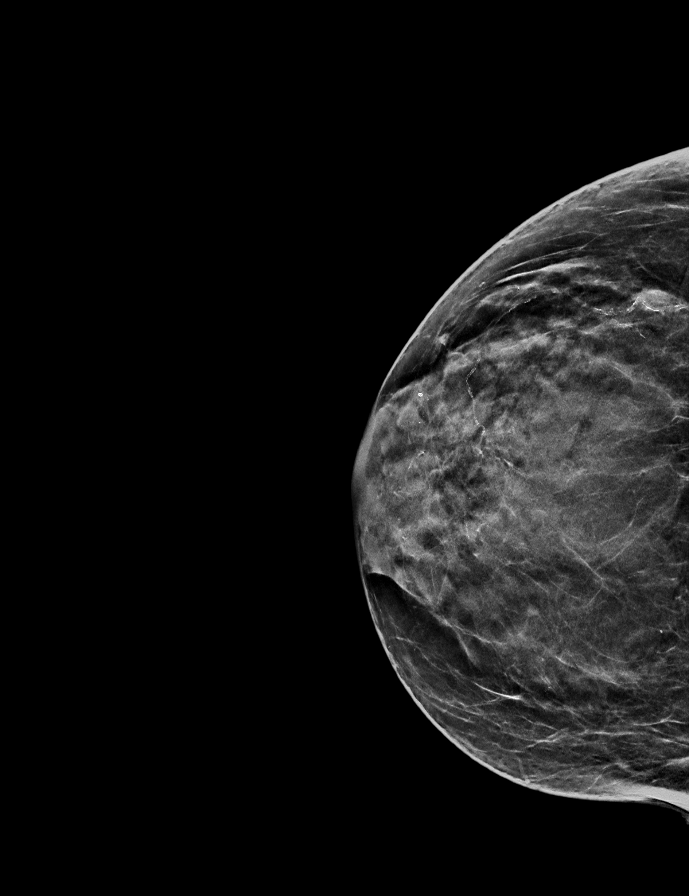

[L MLO tomo · tomo slice 23/45.0]
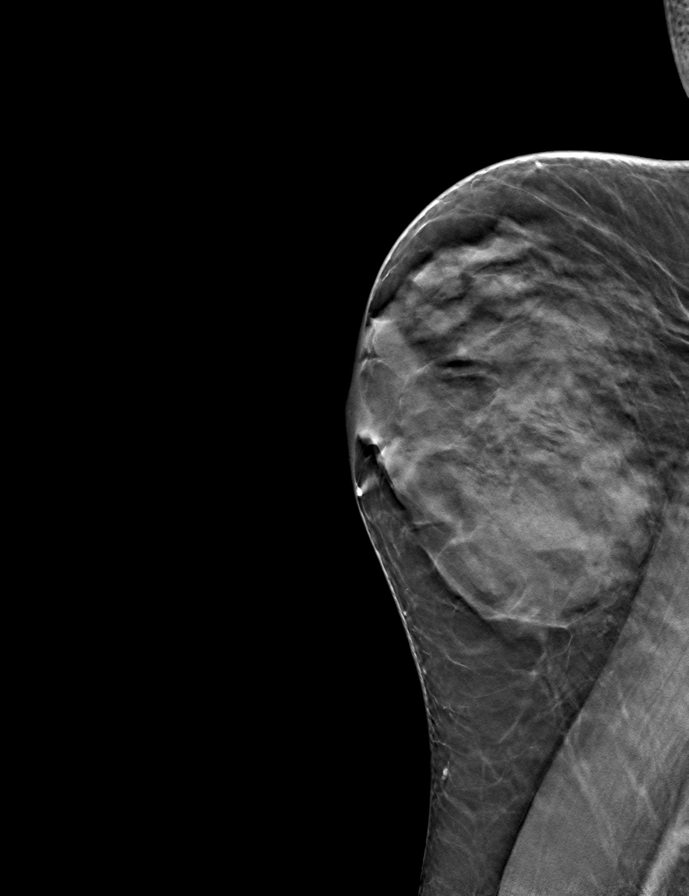

[9 of 28 positions shown; findings below may reference images not displayed]

ACR Breast Density Category c: The breast tissue is heterogeneously
dense, which may obscure small masses.
FINDINGS: There are no findings suspicious for malignancy. Images were
processed with CAD.
IMPRESSION: No mammographic evidence of malignancy. A result letter of this
screening mammogram will be mailed directly to the patient.

RECOMMENDATION:
Screening mammogram in one year. (Code:[TA])

BI-RADS CATEGORY  1: Negative.

## 2016-10-21 HISTORY — PX: BASAL CELL CARCINOMA EXCISION: SHX1214

## 2017-02-09 DIAGNOSIS — G4700 Insomnia, unspecified: Secondary | ICD-10-CM | POA: Insufficient documentation

## 2017-02-10 ENCOUNTER — Encounter: Payer: Self-pay | Admitting: Certified Nurse Midwife

## 2017-02-10 ENCOUNTER — Ambulatory Visit (INDEPENDENT_AMBULATORY_CARE_PROVIDER_SITE_OTHER): Payer: BLUE CROSS/BLUE SHIELD | Admitting: Certified Nurse Midwife

## 2017-02-10 VITALS — BP 104/64 | HR 69 | Ht 60.0 in | Wt 117.0 lb

## 2017-02-10 DIAGNOSIS — Z124 Encounter for screening for malignant neoplasm of cervix: Secondary | ICD-10-CM

## 2017-02-10 DIAGNOSIS — M545 Low back pain, unspecified: Secondary | ICD-10-CM

## 2017-02-10 DIAGNOSIS — Z1231 Encounter for screening mammogram for malignant neoplasm of breast: Secondary | ICD-10-CM | POA: Diagnosis not present

## 2017-02-10 DIAGNOSIS — Z01419 Encounter for gynecological examination (general) (routine) without abnormal findings: Secondary | ICD-10-CM | POA: Diagnosis not present

## 2017-02-10 DIAGNOSIS — Z1239 Encounter for other screening for malignant neoplasm of breast: Secondary | ICD-10-CM

## 2017-02-10 MED ORDER — CYCLOBENZAPRINE HCL 10 MG PO TABS: ORAL_TABLET | ORAL | 0 refills | Status: DC

## 2017-02-10 NOTE — Progress Notes (Signed)
Gynecology Annual Exam  PCP: Maryland Pink, MD  Chief Complaint:  Chief Complaint  Patient presents with  . Gynecologic Exam    History of Present Illness:Jordan Mann is a 61 year old Caucasian/White female, G3 P3003, who presents for her annual exam. She is not feeling well today. 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 She complains of pain in her lower back with movement, bending over, sitting up. Has applied ice and heat with some relief. No pain extending down legs. Has had this pain before and has had diagnostic tests, with no evidence of HNP. Has some arthritis of her vertebrae.  At the time of her last annual, she was referred to a urogynecologist for evaluation of stool incontinence. She was placed on a fiber laxative (Benefiber) and taught Kegel exercises. She feels tht these measures have helped and that the problem "is not as bad."She has a history of urgency incontinence and she had a cystoscopy done in evaluation of this problem. She continues to take Myrbetriq for this problem.  Her menses are absent and she is postmenopausal.  She has had no spotting.   The patient's past medical history is also notable for a history of Hypertension, arthritis, mild depression,glaucoma, and elevated cholesterol.  Since her last annual GYN exam dated 02/04/2016 , she has had Moh's surgery for basal cell cancer on her nose [facility]).  Her most recent pap smear was obtained 02/04/16 and was negative.  Her most recent mammogram obtained on 05/14/2016 was normal.  There is no family history of breast cancer.  There is no family history of ovarian cancer.  The patient does do monthly self breast exams.  She had a colonoscopy in 2010 that was normal. Her next colonoscopy is due in 2 years (2020).  She had a recent DEXA scan obtained in 2015 that was normal.  The patient does not smoke.  The patient does not drink alcohol.  The patient does not use illegal  drugs.  The patient is active at work cleaning houses. The patient does get adequate calcium with her diet.  She had a recent cholesterol screen in 2018 that was abnormal.   The patient denies current symptoms of depression.    Review of Systems: Review of Systems  Constitutional: Negative for chills, fever and weight loss.  HENT: Negative for congestion, sinus pain and sore throat.   Eyes: Negative for blurred vision and pain.  Respiratory: Negative for hemoptysis, shortness of breath and wheezing.   Cardiovascular: Negative for chest pain, palpitations and leg swelling.  Gastrointestinal: Negative for abdominal pain, blood in stool, diarrhea, heartburn, nausea and vomiting.  Genitourinary: Positive for frequency. Negative for dysuria, hematuria and urgency.  Musculoskeletal: Positive for back pain. Negative for joint pain and myalgias.  Skin: Negative for itching and rash.  Neurological: Negative for dizziness, tingling and headaches.  Endo/Heme/Allergies: Negative for environmental allergies and polydipsia. Does not bruise/bleed easily.       Negative for hirsutism   Psychiatric/Behavioral: Negative for depression. The patient is not nervous/anxious and does not have insomnia.     Past Medical History:  Past Medical History:  Diagnosis Date  . Arthritis   . Basal cell carcinoma   . Fecal incontinence   . Glaucoma   . History of mammogram   . Hypercholesteremia   . Hyperthyroidism   . Mild depression ([place])   . Overactive bladder     Past Surgical History:  Past Surgical  History:  Procedure Laterality Date  . BASAL CELL CARCINOMA EXCISION  10/2016   Moh's surgery  . COLONOSCOPY  2010  . CYSTOSCOPY  2015  . ESOPHAGOGASTRODUODENOSCOPY  2011  . myringtomy    . TUBAL LIGATION      Family History:  Family History  Problem Relation Age of Onset  . Diabetes type II Mother   . Hypertension Mother   . Cervical cancer Sister 38  . Paranoid behavior Son   . Breast  cancer Neg Hx   . Colon cancer Neg Hx   . Ovarian cancer Neg Hx     Social History:  Social History   Social History  . Marital status: Married    Spouse name: N/A  . Number of children: 3  . Years of education: N/A   Occupational History  . 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 Smoking status: Never Smoker  . Smokeless tobacco: Never Used  . Alcohol use No  . Drug use: No  . Sexual activity: Not on file   Other Topics Concern  . Not on file   Social History Narrative  . No narrative on file    Allergies:  Allergies  Allergen Reactions  . Prednisone     Make her feel nauseous and "funny in the head"    Medications: Prior to Admission medications   Medication Sig Start Date End Date Taking? Authorizing Provider  bimatoprost (LUMIGAN) 0.03 % ophthalmic solution 1 drop nightly.   Yes [provider]  brimonidine (ALPHAGAN) 0.2 % ophthalmic solution INT 1 GTT IN OU Q 12 H 12/24/16  Yes [provider]  mirabegron ER (MYRBETRIQ) 50 MG TB24 tablet Take 50 mg by mouth. 06/01/16  Yes [provider]  mometasone (NASONEX) 50 MCG/ACT nasal spray SHAKE LIQUID WELL AND USE 2 SPRAYS IN EACH NOSTRIL EVERY DAY 04/13/16  Yes [provider]  rosuvastatin (CRESTOR) 5 MG tablet TK 1 T PO QD 11/30/16  Yes [provider]  traZODone (DESYREL) 50 MG tablet Take 50 mg by mouth.   Yes [provider]    Physical Exam Vitals: BP 104/64   Pulse 69   Ht 5' (1.524 m)   Wt 117 lb (53.1 kg)   LMP  (Exact Date)   BMI 22.85 kg/m   General: petite WF who appears uncomfortable HEENT: normocephalic, anicteric Neck: no thyroid enlargement, no palpable nodules, no cervical lymphadenopathy  Pulmonary: No increased work of breathing, CTAB Cardiovascular: RRR, without murmur  Breast: Breast symmetrical, no tenderness, no palpable nodules or masses, no skin or nipple retraction present, no nipple  discharge.  No axillary, infraclavicular or supraclavicular lymphadenopathy. Abdomen: Soft, non-tender, non-distended.  Umbilicus without lesions.  No hepatomegaly or masses palpable. No evidence of hernia. Genitourinary:  External: Normal external female genitalia.  Normal urethral meatus, normal Bartholin's and Skene's glands.    Vagina: Normal vaginal mucosa, no evidence of prolapse.    Cervix: Grossly normal in appearance, no bleeding, non-tender  Uterus: Anteverted, small, normal shape and consistency, mobile, and non-tender  Adnexa: No adnexal masses, non-tender  Rectal: deferred  Lymphatic: no evidence of inguinal lymphadenopathy Extremities: no edema, erythema, or tenderness Neurologic: Grossly intact Psychiatric: mood appropriate, affect full Back: points to sacral area where she is having pain. No point tenderness. Paraspinal muscular spasm on left>right    Assessment: 61 y.o. G3P3003 well woman exam Acute lower back pain/ muscle strain  Plan:   1) Breast  cancer screening - recommend monthly self breast exam and annual mammograms Mammogram was ordered today.  2) Cervical cancer screening - Pap was done. ASCCP guidelines and rational discussed.  Patient opts for yearly screening interval  3) Lower back strain: Flexeril RX : advised to use 5 mgm tid prn. Discussed sedative effects. No driving after taking medication. Continue heat/ice  4) Routine healthcare maintenance including cholesterol and diabetes screening managed by PCP   5) Discuss getting DEXA with PCP.  Dalia Heading, CNM

## 2017-02-11 ENCOUNTER — Encounter: Payer: Self-pay | Admitting: Certified Nurse Midwife

## 2017-02-12 LAB — IGP, APTIMA HPV
HPV Aptima: NEGATIVE
PAP Smear Comment: 0

## 2017-05-16 ENCOUNTER — Ambulatory Visit
Admission: RE | Admit: 2017-05-16 | Discharge: 2017-05-16 | Disposition: A | Discharge status: Home / Self Care | Payer: BLUE CROSS/BLUE SHIELD | Source: Ambulatory Visit | Attending: Certified Nurse Midwife | Admitting: Certified Nurse Midwife

## 2017-05-16 DIAGNOSIS — Z1231 Encounter for screening mammogram for malignant neoplasm of breast: Secondary | ICD-10-CM | POA: Diagnosis present

## 2017-05-16 DIAGNOSIS — Z1239 Encounter for other screening for malignant neoplasm of breast: Secondary | ICD-10-CM

## 2017-05-16 IMAGING — MG MM DIGITAL SCREENING BILAT W/ TOMO W/ CAD
8 of 12 series · 8 of 28 positions shown · non-contrast
Comparison: Previous exam(s).

CLINICAL DATA: Screening.

EXAM:
2D DIGITAL SCREENING BILATERAL MAMMOGRAM WITH CAD AND ADJUNCT TOMO

[R MLO synth-2D]
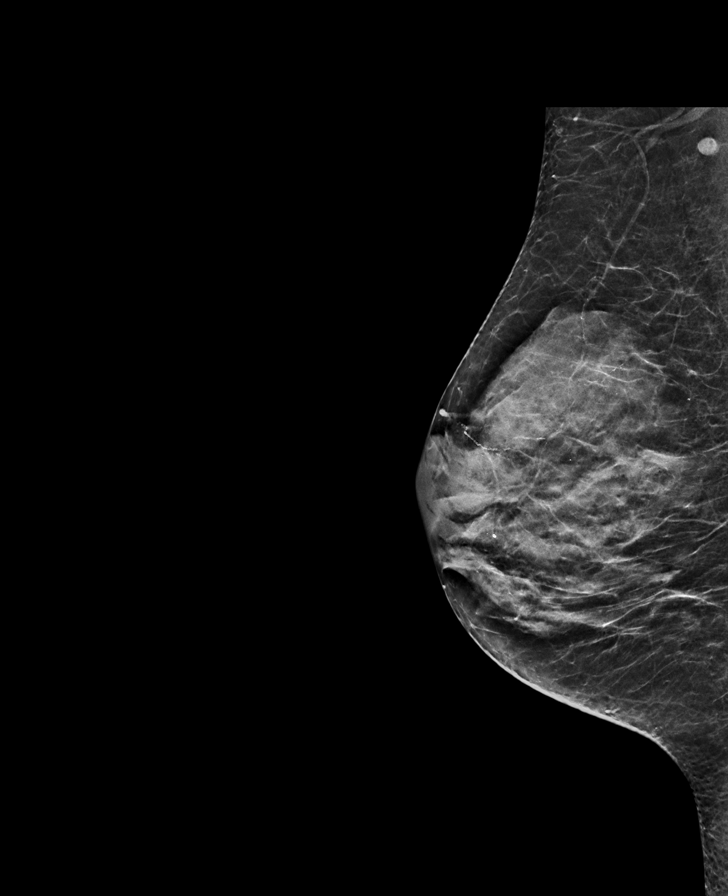

[L MLO synth-2D]
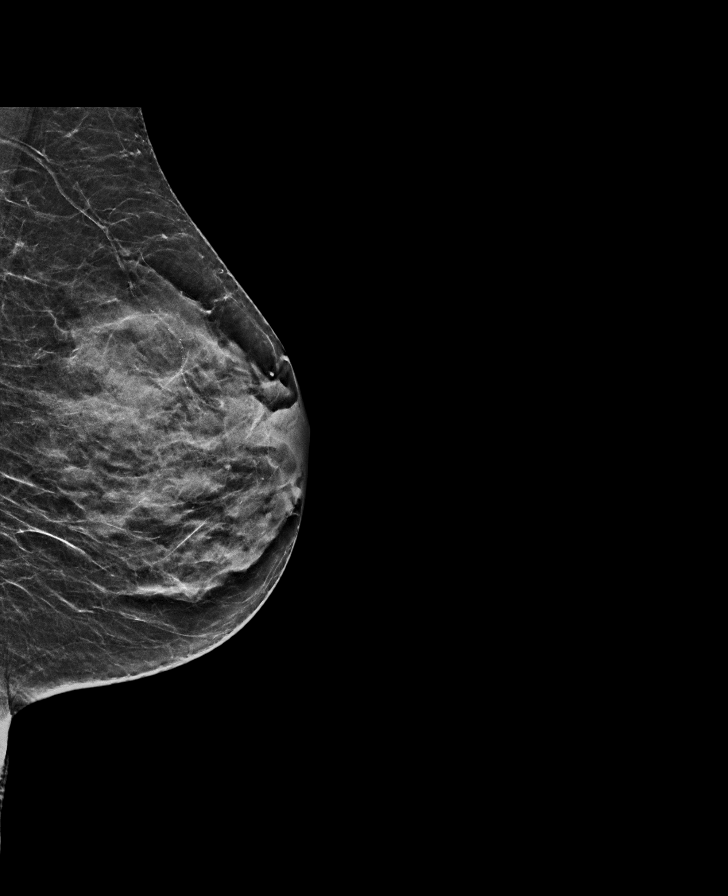

[R MLO]
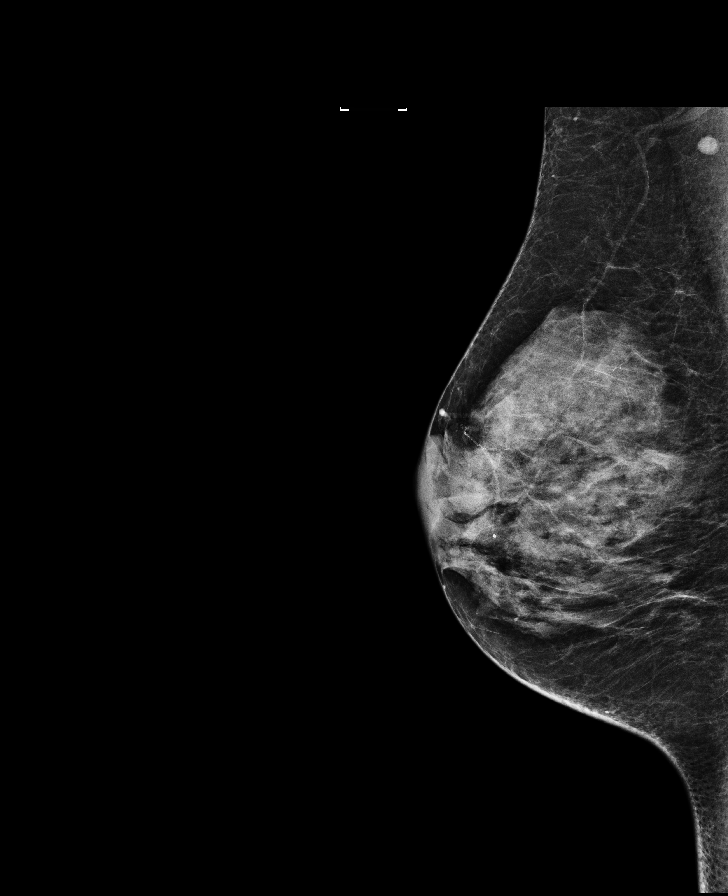

[R CC]
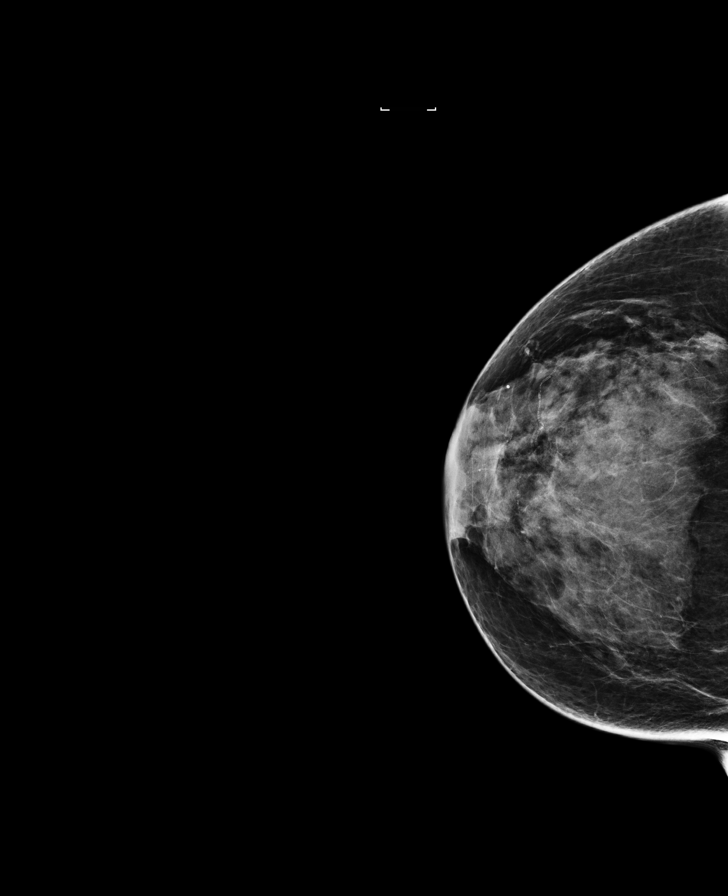

[L MLO]
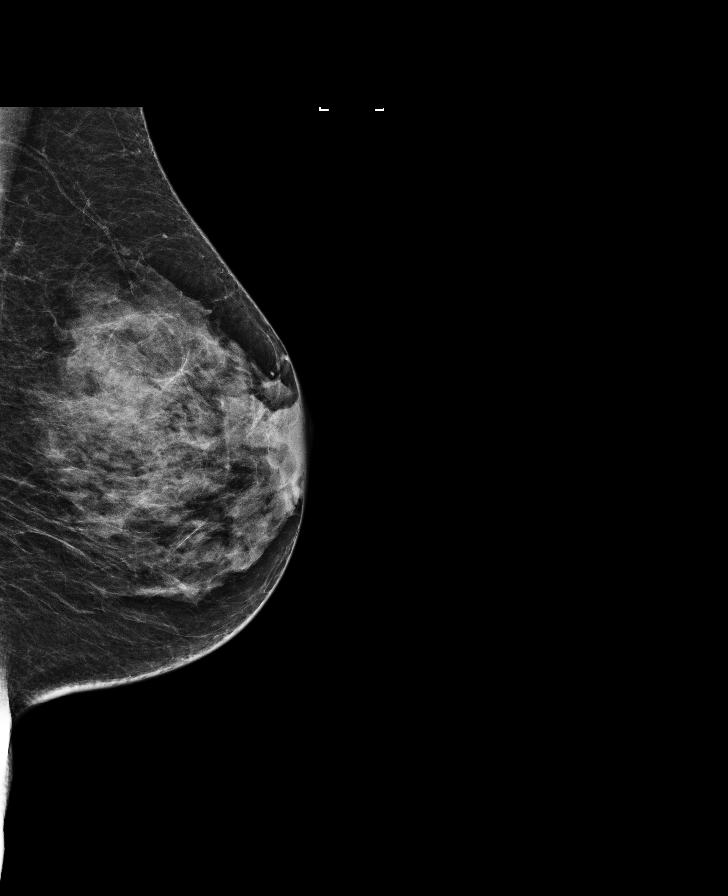

[L CC]
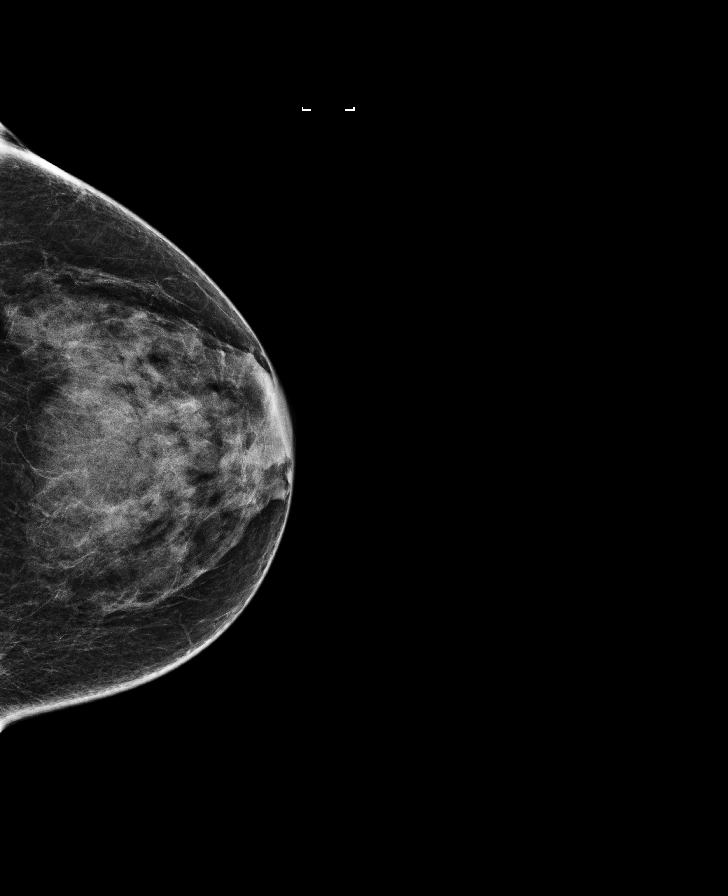

[R CC synth-2D]
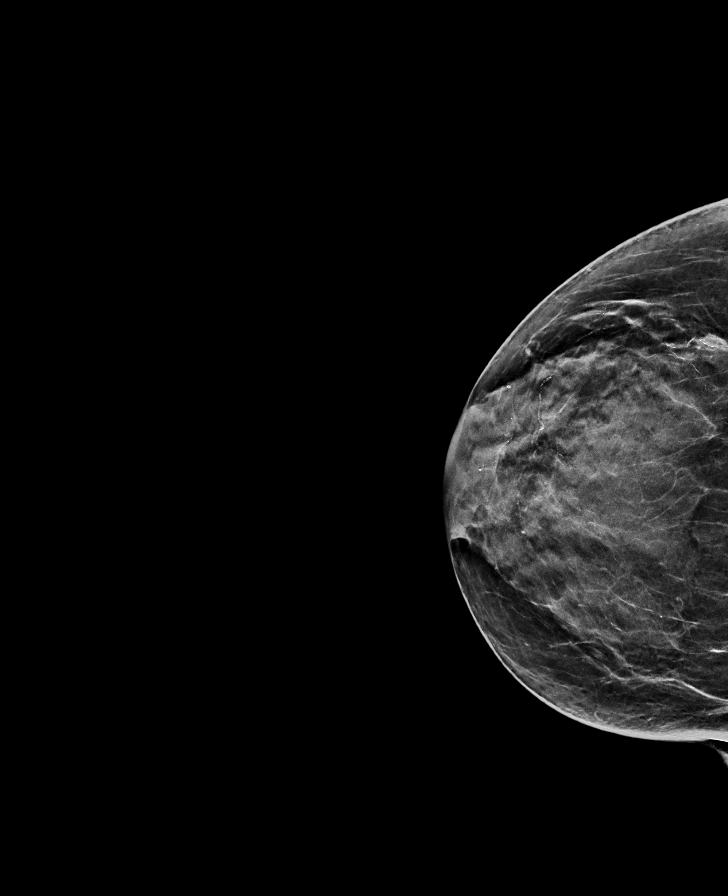

[L CC synth-2D]
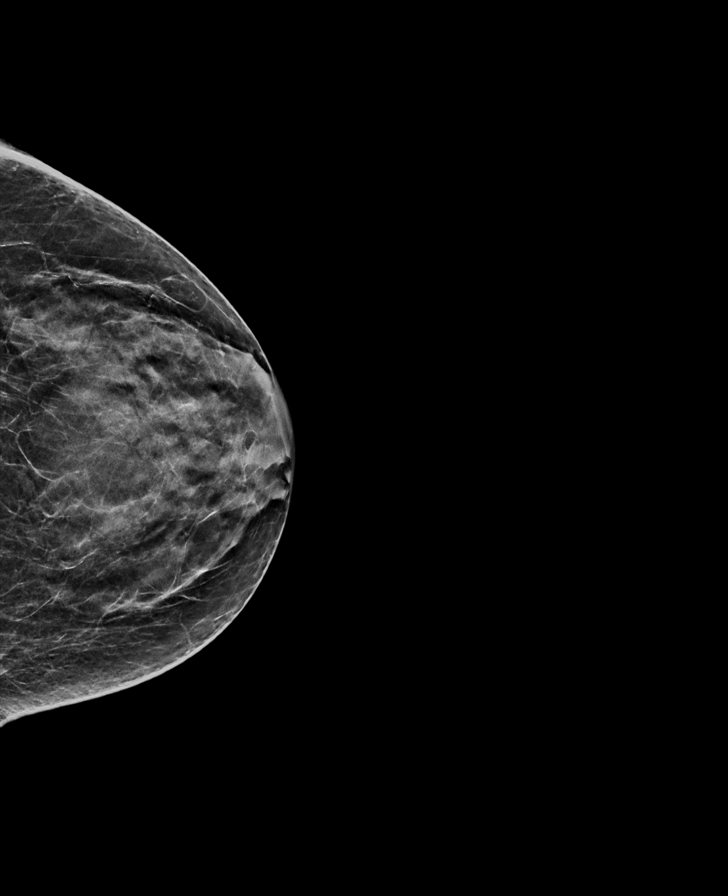

[8 of 28 positions shown; findings below may reference images not displayed]

ACR Breast Density Category c: The breast tissue is heterogeneously
dense, which may obscure small masses.
FINDINGS: There are no findings suspicious for malignancy. Images were
processed with CAD.
IMPRESSION: No mammographic evidence of malignancy. A result letter of this
screening mammogram will be mailed directly to the patient.

RECOMMENDATION:
Screening mammogram in one year. (Code:[TA])

BI-RADS CATEGORY  1: Negative.

## 2018-02-17 ENCOUNTER — Encounter: Payer: Self-pay | Admitting: Certified Nurse Midwife

## 2018-02-17 ENCOUNTER — Ambulatory Visit: Payer: BLUE CROSS/BLUE SHIELD | Admitting: Certified Nurse Midwife

## 2018-02-17 VITALS — BP 122/78 | HR 51 | Ht 60.0 in | Wt 115.0 lb

## 2018-02-17 DIAGNOSIS — Z01411 Encounter for gynecological examination (general) (routine) with abnormal findings: Secondary | ICD-10-CM

## 2018-02-17 DIAGNOSIS — Z01419 Encounter for gynecological examination (general) (routine) without abnormal findings: Secondary | ICD-10-CM

## 2018-02-17 DIAGNOSIS — N3281 Overactive bladder: Secondary | ICD-10-CM | POA: Diagnosis not present

## 2018-02-17 DIAGNOSIS — R51 Headache: Secondary | ICD-10-CM | POA: Diagnosis not present

## 2018-02-17 DIAGNOSIS — Z1231 Encounter for screening mammogram for malignant neoplasm of breast: Secondary | ICD-10-CM | POA: Diagnosis not present

## 2018-02-17 DIAGNOSIS — N3941 Urge incontinence: Secondary | ICD-10-CM

## 2018-02-17 DIAGNOSIS — Z1239 Encounter for other screening for malignant neoplasm of breast: Secondary | ICD-10-CM

## 2018-02-17 DIAGNOSIS — Z124 Encounter for screening for malignant neoplasm of cervix: Secondary | ICD-10-CM

## 2018-02-17 NOTE — Progress Notes (Addendum)
Gynecology Annual Exam  PCP: Maryland Pink, MD  Chief Complaint:  Chief Complaint  Patient presents with  . Gynecologic Exam    History of Present Illness:Jordan Mann is a 62 year old Caucasian/White female, G3 P3003, who presents for her annual exam.  She continues to have problems with OAB despite Mybetriq, voiding every hour or more. She is also having urgency urinary incontinence and is wearing a Discreet panty liner. She denies stress urinary incontinence.  In 2017, she was referred to a urogynecologist for evaluation of stool incontinence. She was placed on a fiber laxative (Benefiber) and taught Kegel exercises. She feels tht these measures have helped and that the problem "is not as bad." She had a cystoscopy done in the past for evaluation of her urinary incontinence.Marland Kitchen She would like to be referred back to the urogynecologist to learn about other options, like Botox,  for treatment of these problems Her menses are absent and she is postmenopausal.  She has had no spotting.   The patient's past medical history is also notable for a history of Hypertension, arthritis, mild depression,glaucoma, basal cell carcinoma of her nose, and elevated cholesterol.  Since her last annual GYN exam dated 02/10/2017 , she has had no other  significant changes in her health  Her most recent pap smear was obtained 02/10/2017 and was NIL/neg HRHPV  Her most recent mammogram obtained on 05/16/2017 was normal.  There is no family history of breast cancer.  There is no family history of ovarian cancer.  The patient does do monthly self breast exams.  She had a colonoscopy in 2010 that was normal. Her next colonoscopy is due in 2 years (2020).  She had a recent DEXA scan obtained in 2019 that revealed osteopenia of the femoral neck (-1.7). FRAX score was 8.8% and 0.9% The patient does not smoke.  The patient does not drink alcohol.  The patient does not use illegal drugs.  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 The patient does get adequate calcium with her diet.  She had a recent cholesterol screen in 2018 that was abnormal.   The patient denies current symptoms of depression.    Review of Systems: Review of Systems  Constitutional: Negative for chills, fever and weight loss.  HENT: Negative for congestion, sinus pain and sore throat.   Eyes: Negative for blurred vision and pain.  Respiratory: Negative for hemoptysis, shortness of breath and wheezing.   Cardiovascular: Negative for chest pain, palpitations and leg swelling.  Gastrointestinal: Negative for abdominal pain, blood in stool, diarrhea, heartburn, nausea and vomiting.  Genitourinary: Positive for frequency. Negative for dysuria, hematuria and urgency.       Positive for urgency incontinence  Musculoskeletal: Negative for back pain, joint pain and myalgias.  Skin: Negative for itching and rash.  Neurological: Positive for tingling and headaches. Negative for dizziness.  Endo/Heme/Allergies: Positive for environmental allergies (sneezing). Negative for polydipsia. Does not bruise/bleed easily.       Negative for hirsutism   Psychiatric/Behavioral: Negative for depression. The patient is not nervous/anxious and does not have insomnia.     Past Medical History:  Past Medical History:  Diagnosis Date  . Arthritis   . Basal cell carcinoma   . Fecal incontinence   . Glaucoma   . History of mammogram   . Hypercholesteremia   . Hyperthyroidism 1990   Graves disease -treated with PTU-now resolved.  . Mild depression ([place])   .  Overactive bladder     Past Surgical History:  Past Surgical History:  Procedure Laterality Date  . BASAL CELL CARCINOMA EXCISION  10/2016   Moh's surgery  . COLONOSCOPY  2010  . CYSTOSCOPY  2015  . ESOPHAGOGASTRODUODENOSCOPY  2011  . myringtomy    . TUBAL LIGATION      Family History:  Family History  Problem Relation Age of Onset  . Diabetes  type II Mother   . Hypertension Mother   . Cervical cancer Sister 26  . Paranoid behavior Son   . Breast cancer Neg Hx   . Colon cancer Neg Hx   . Ovarian cancer Neg Hx     Social History:  Social History   Socioeconomic History  . Marital status: Married    Spouse name: Not on file  . Number of children: 3  . Years of education: Not on file  . Highest education level: Not on file  Occupational History  . Occupation: Microbiologist houses    Employer: [employer]  . Financial resource strain: Not on file  . Food insecurity:    Worry: Not on file    Inability: Not on file  . Transportation needs:    Medical: Not on file    Non-medical: Not on file  Tobacco Use  . Smoking status: Never Smoker  . Smokeless tobacco: Never Used  Substance and Sexual Activity  . Alcohol use: No  . Drug use: No  . Sexual activity: Not on file  Lifestyle  . Physical activity:    Days per week: Not on file    Minutes per session: Not on file  . Stress: Not on file  Relationships  . Social connections:    Talks on phone: Not on file    Gets together: Not on file    Attends religious service: Not on file    Active member of club or organization: Not on file    Attends meetings of clubs or organizations: Not on file    Relationship status: Not on file  . Intimate partner violence:    Fear of current or ex partner: Not on file    Emotionally abused: Not on file    Physically abused: Not on file    Forced sexual activity: Not on file  Other Topics Concern  . Not on file  Social History Narrative  . Not on file    Allergies:  Allergies  Allergen Reactions  . Prednisone     Make her feel nauseous and "funny in the head"    Medications: Prior to Admission medications   Medication Sig Start Date End Date Taking? Authorizing Provider  bimatoprost (LUMIGAN) 0.03 % ophthalmic solution 1 drop nightly.   Yes [provider]  brimonidine (ALPHAGAN)  0.2 % ophthalmic solution INT 1 GTT IN OU Q 12 H 12/24/16  Yes [provider]  mirabegron ER (MYRBETRIQ) 50 MG TB24 tablet Take 50 mg by mouth. 06/01/16  Yes [provider]  mometasone (NASONEX) 50 MCG/ACT nasal spray SHAKE LIQUID WELL AND USE 2 SPRAYS IN EACH NOSTRIL EVERY DAY 04/13/16  Yes [provider]  rosuvastatin (CRESTOR) 5 MG tablet TK 1 T PO QD 11/30/16  Yes [provider]  traZODone (DESYREL) 50 MG tablet Take 50 mg by mouth.   Yes [provider]    Physical Exam Vitals: BP 122/78   Pulse (!) 51   Ht 5' (1.524 m)   Wt 115  lb (52.2 kg)   BMI 22.46 kg/m   General: petite WF in NAD HEENT: normocephalic, anicteric Neck: no thyroid enlargement, no palpable nodules, no cervical lymphadenopathy  Pulmonary: No increased work of breathing, CTAB Cardiovascular: RRR, without murmur  Breast: Breast symmetrical, no tenderness, no palpable nodules or masses, no skin or nipple retraction present, no nipple discharge.  No axillary, infraclavicular or supraclavicular lymphadenopathy. Abdomen: Soft, non-tender, non-distended.  Umbilicus without lesions.  No hepatomegaly or masses palpable. No evidence of hernia. Genitourinary:  External: Atrophic changes.  Normal urethral meatus, normal Bartholin's and Skene's glands.    Vagina: Decreased rugae, no evidence of prolapse.    Cervix: Grossly normal in appearance, no bleeding, non-tender  Uterus: Anteverted, small, normal shape and consistency, mobile, and non-tender  Adnexa: No adnexal masses, non-tender  Rectal: deferred  Lymphatic: no evidence of inguinal lymphadenopathy Extremities: no edema, erythema, or tenderness Neurologic: Grossly intact Psychiatric: mood appropriate, affect full     Assessment: 62 y.o. [id number] well woman exam Overactive bladder/ Urgency incontinence  Will refer back to urogynecology  Plan:   1) Breast cancer screening - recommend monthly self breast exam and  annual mammograms Mammogram was ordered today. Patient to schedule after 05/16/2017  2) Cervical cancer screening - Pap was done. ASCCP guidelines and rational discussed.  Patient opts for yearly screening interval  3)Colon cancer screening-colonoscopy UTD. Next due next year.  4) Routine healthcare maintenance including cholesterol and diabetes screening managed by PCP   5) Discussed calcium and vitamin D3 requirements.  Dalia Heading, CNM

## 2018-02-18 ENCOUNTER — Encounter: Payer: Self-pay | Admitting: Certified Nurse Midwife

## 2018-02-18 DIAGNOSIS — N3281 Overactive bladder: Secondary | ICD-10-CM | POA: Insufficient documentation

## 2018-02-18 DIAGNOSIS — C4491 Basal cell carcinoma of skin, unspecified: Secondary | ICD-10-CM | POA: Insufficient documentation

## 2018-02-18 DIAGNOSIS — H409 Unspecified glaucoma: Secondary | ICD-10-CM | POA: Insufficient documentation

## 2018-02-21 LAB — IGP,RFX APTIMA HPV ALL PTH: PAP Smear Comment: 0

## 2018-05-19 ENCOUNTER — Ambulatory Visit
Admission: RE | Admit: 2018-05-19 | Discharge: 2018-05-19 | Disposition: A | Discharge status: Home / Self Care | Payer: BLUE CROSS/BLUE SHIELD | Source: Ambulatory Visit | Attending: Certified Nurse Midwife | Admitting: Certified Nurse Midwife

## 2018-05-19 DIAGNOSIS — Z1239 Encounter for other screening for malignant neoplasm of breast: Secondary | ICD-10-CM

## 2018-05-19 DIAGNOSIS — Z1231 Encounter for screening mammogram for malignant neoplasm of breast: Secondary | ICD-10-CM | POA: Diagnosis present

## 2018-05-19 IMAGING — MG MM DIGITAL SCREENING BILAT W/ TOMO W/ CAD
6 of 10 series · 6 of 30 positions shown · non-contrast
Comparison: Previous exam(s).

CLINICAL DATA: Screening.

EXAM:
DIGITAL SCREENING BILATERAL MAMMOGRAM WITH TOMO AND CAD

[R MLO synth-2D]
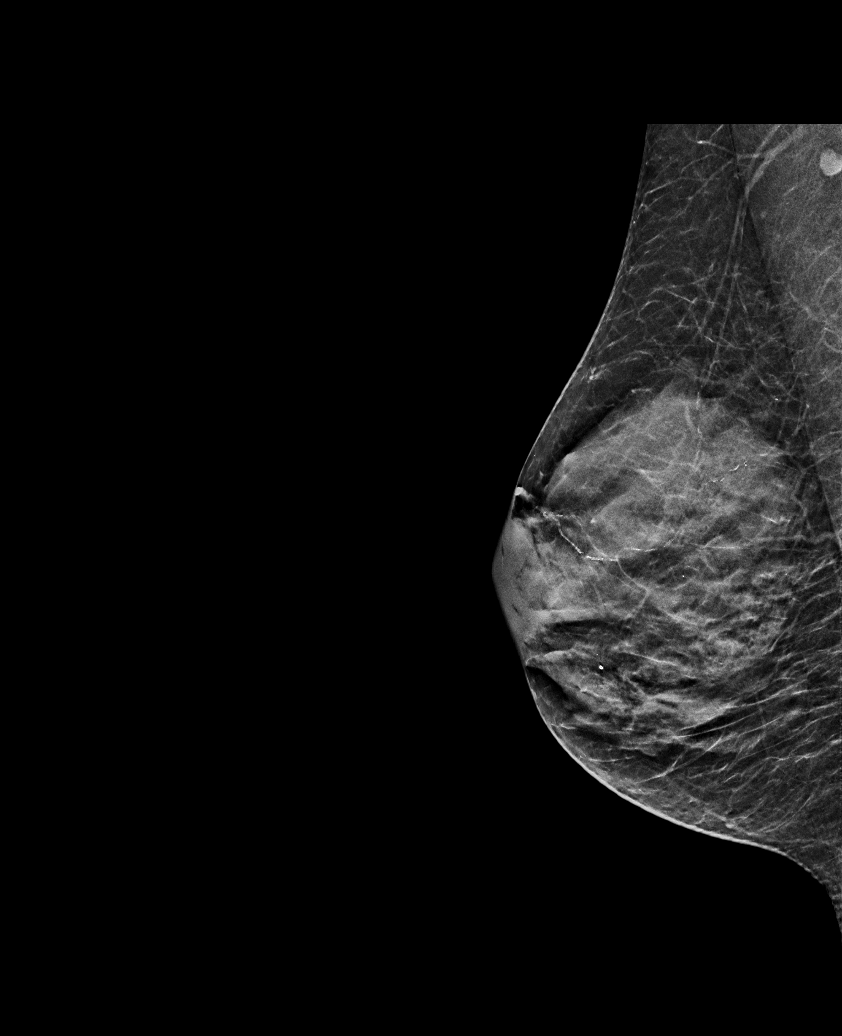

[R CC synth-2D (1 of 2)]
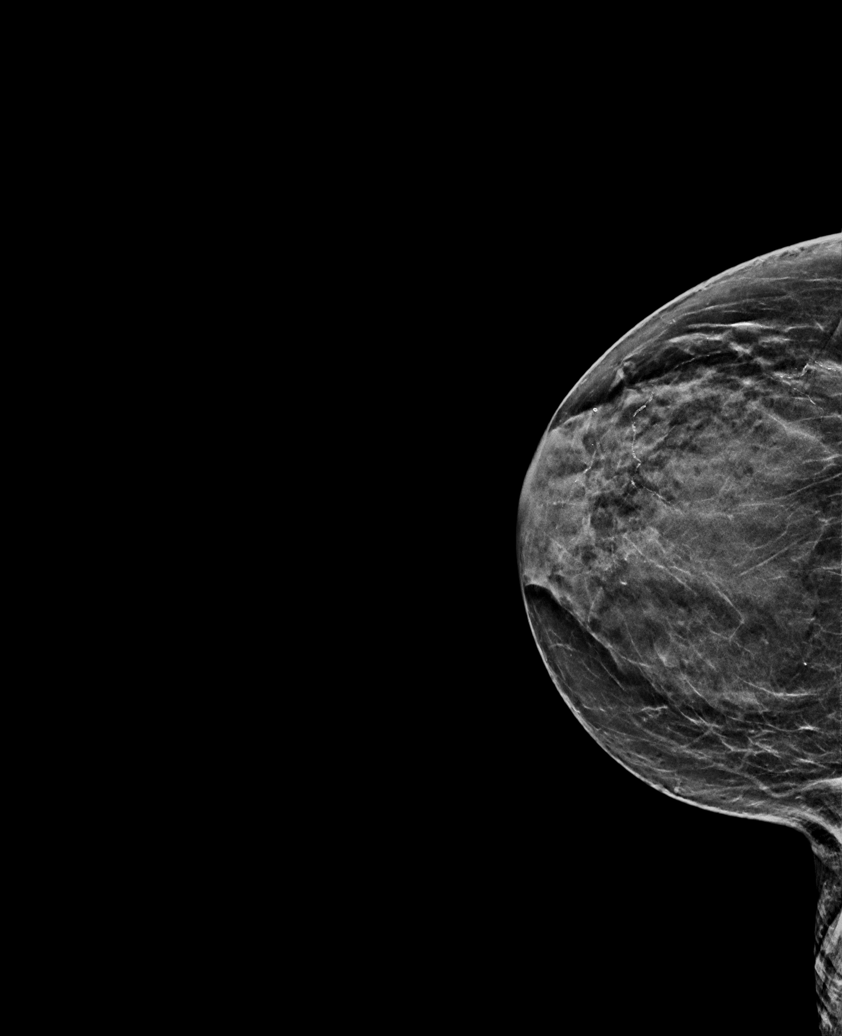

[L MLO synth-2D]
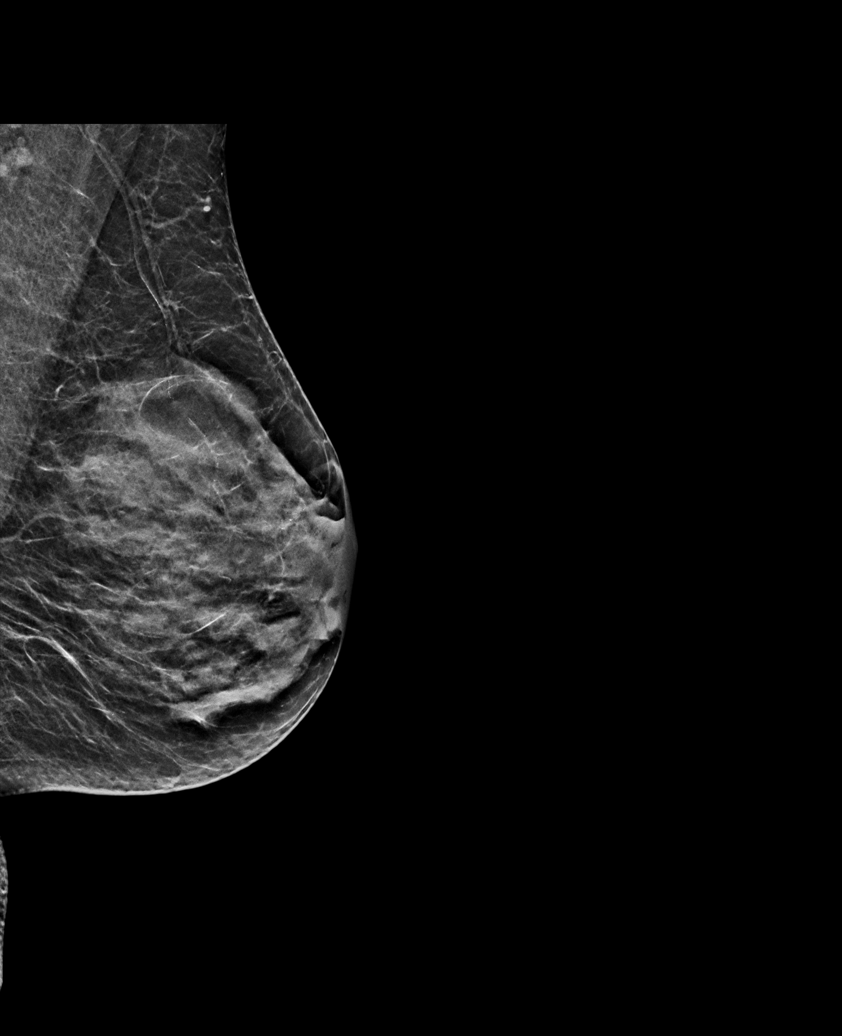

[L CC synth-2D]
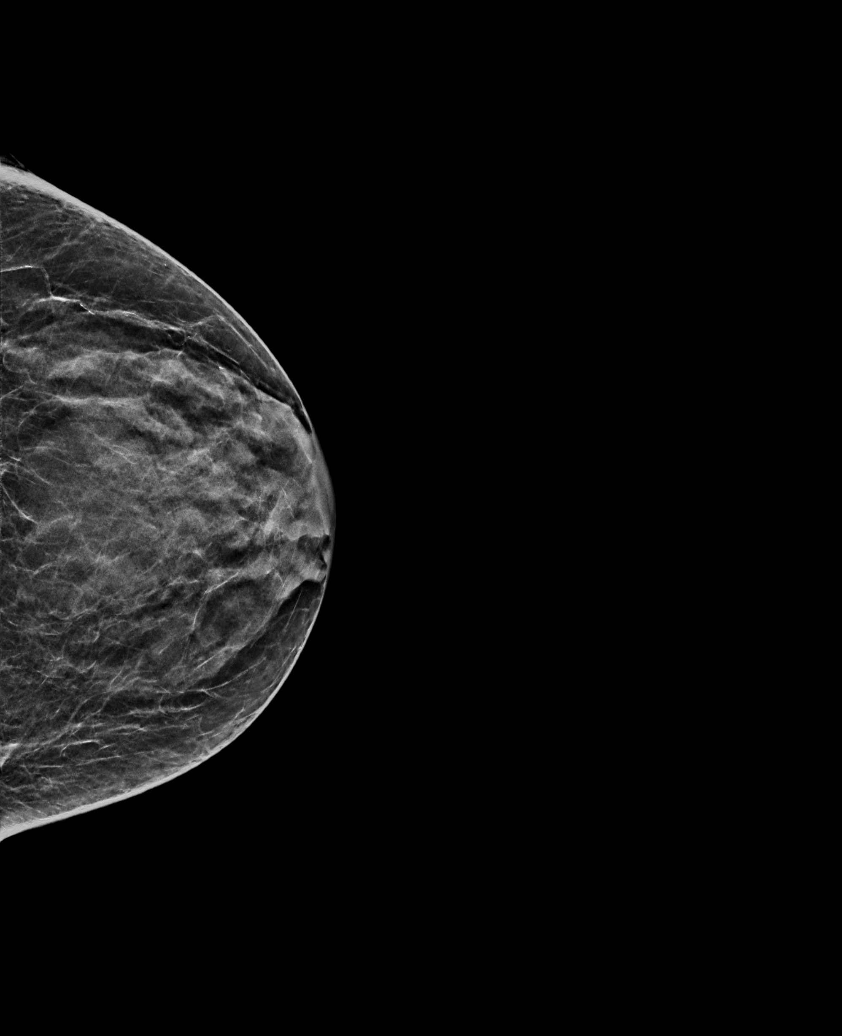

[R CC synth-2D (2 of 2)]
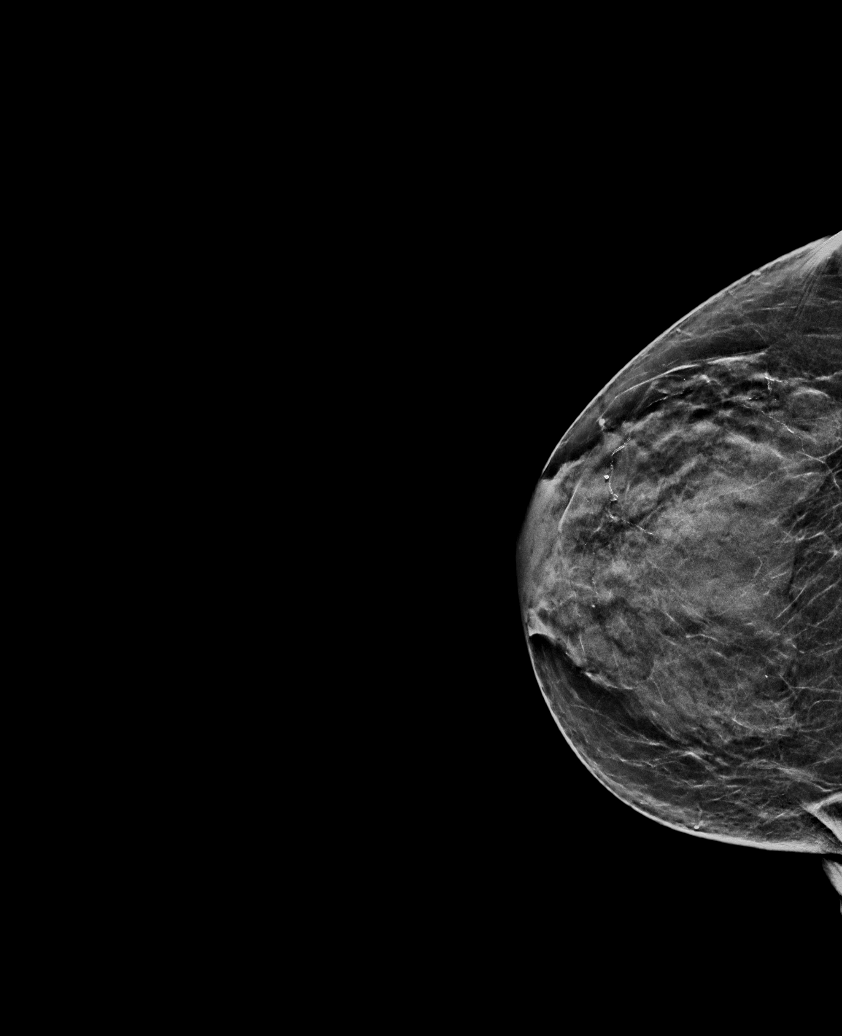

[R CC tomo · tomo slice 23/46.0]
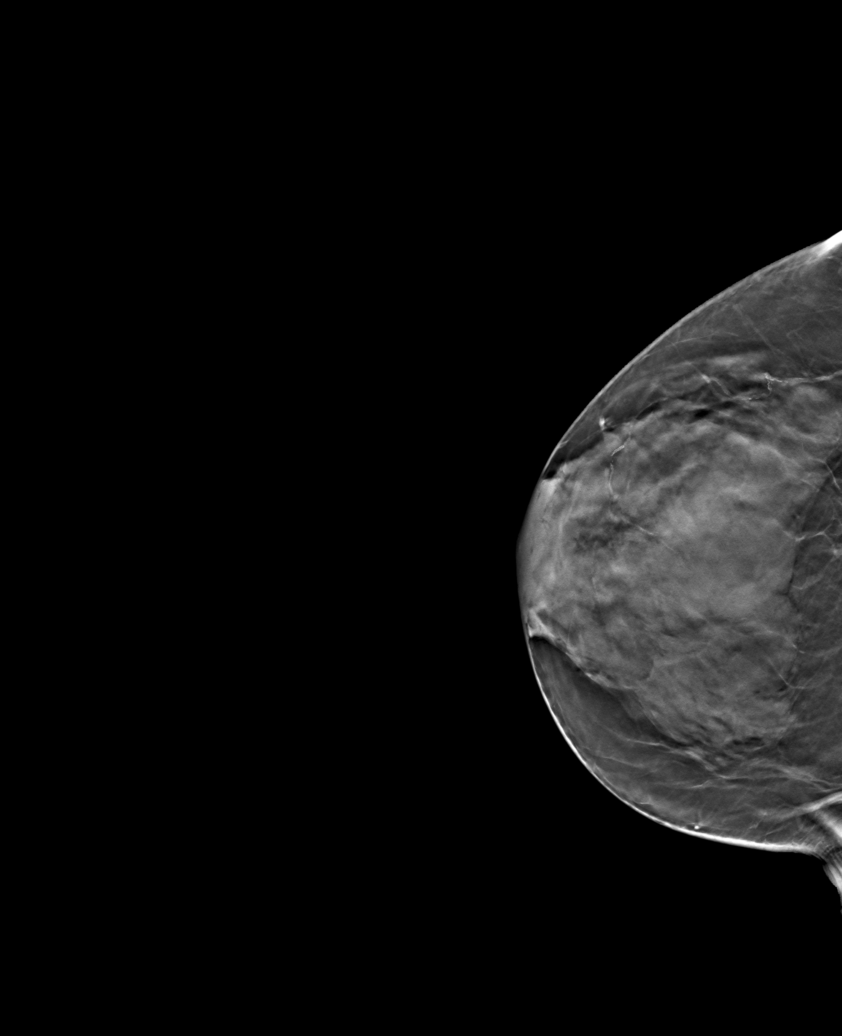

[6 of 30 positions shown; findings below may reference images not displayed]

ACR Breast Density Category d: The breast tissue is extremely dense,
which lowers the sensitivity of mammography.
FINDINGS: There are no findings suspicious for malignancy. Images were
processed with CAD.
IMPRESSION: No mammographic evidence of malignancy. A result letter of this
screening mammogram will be mailed directly to the patient.

RECOMMENDATION:
Screening mammogram in one year. (Code:[PU])

BI-RADS CATEGORY  1: Negative.

## 2019-02-18 NOTE — Progress Notes (Signed)
Gynecology Annual Exam  PCP: Maryland Pink, MD  Chief Complaint:  Chief Complaint  Patient presents with  . Gynecologic Exam    No complaints    History of Present Illness:Jordan Mann is a 63 year old Caucasian/White female, G3 P3003, who presents for her annual exam.  She continues to have problems with OAB despite Mybetriq and was seen by a urogynecologist last year to discuss Botox injections. She was worried about possible side effects and decided not to have the Botox therapy. She also has concerns regarding hypoactive sexual arousal.  Her menses are absent and she is postmenopausal.  She has had no spotting.   The patient's past medical history is also notable for a history of hypertension, arthritis, mild depression,glaucoma, basal cell carcinoma of her nose, and elevated cholesterol.  Since her last annual GYN exam dated 02/17/2018 , she reports having two episodes of lower abdominal discomfort and nausea last week. She took Immodium multisystem and the symptoms eventually went away. Has had similar but infrequent episodes over the last 3 years. No blood in stool. No bloating  Her most recent pap smear was obtained 02/17/2018 and was NIL. Her most recent mammogram obtained on 05/19/2018 was normal.  There is no family history of breast cancer.  There is no family history of ovarian cancer.  The patient does do monthly self breast exams.  She had a colonoscopy in 2010 that was normal. Her next colonoscopy is due this year. She has an appointment with GI 13 July to discuss colonoscopy.  She had a recent DEXA scan obtained in 2019 that revealed osteopenia of the femoral neck (-1.7). FRAX score was 8.8% and 0.9% The patient does not smoke.  The patient does not drink alcohol.  The patient does not use illegal drugs.  The patient is active at work cleaning houses and walking on the treadmill. The patient may get adequate calcium with her diet. Is somewhat lactose intolerant  She had a recent cholesterol screen in April 2020 that was normal.   The patient denies current symptoms of depression.    Review of Systems: Review of Systems  Constitutional: Negative for chills, fever and weight loss.  HENT: Negative for congestion, sinus pain and sore throat.   Eyes: Negative for blurred vision and pain.  Respiratory: Negative for hemoptysis, shortness of breath and wheezing.   Cardiovascular: Negative for chest pain, palpitations and leg swelling.  Gastrointestinal: Positive for abdominal pain and constipation. Negative for blood in stool, diarrhea, heartburn, nausea and vomiting.  Genitourinary: Positive for frequency and urgency. Negative for dysuria and hematuria.       Positive for urgency incontinence  Musculoskeletal: Positive for joint pain. Negative for back pain and myalgias.  Skin: Negative for itching and rash.  Neurological: Positive for tingling and headaches. Negative for dizziness.  Endo/Heme/Allergies: Positive for environmental allergies (sneezing/coughing). Negative for polydipsia. Does not bruise/bleed easily.       Decreased libido/ sexual arousal   Psychiatric/Behavioral: Negative for depression. The patient is not nervous/anxious and does not have insomnia.     Past Medical History:  Past Medical History:  Diagnosis Date  . Arthritis   . Basal cell carcinoma   . Fecal incontinence   . Glaucoma   . History of mammogram   . Hypercholesteremia   . Hyperthyroidism 1990   Graves disease -treated with PTU-now resolved.  . Mild depression ([place])   . Overactive bladder     Past Surgical History:  Past Surgical History:  Procedure Laterality Date  . BASAL CELL CARCINOMA EXCISION  10/2016   Moh's surgery  . COLONOSCOPY  2010  . CYSTOSCOPY  2015  . ESOPHAGOGASTRODUODENOSCOPY  2011  . myringtomy    . TUBAL LIGATION      Family History:  Family History  Problem Relation Age of Onset  . Diabetes type II Mother   . Hypertension Mother    . Cervical cancer Sister 3  . Paranoid behavior Son   . Breast cancer Neg Hx   . Colon cancer Neg Hx   . Ovarian cancer Neg Hx     Social History:  Social History   Socioeconomic History  . Marital status: Married    Spouse name: Not on file  . Number of children: 3  . Years of education: Not on file  . Highest education level: Not on file  Occupational History  . Occupation: Microbiologist houses    Employer: [employer]  . Financial resource strain: Not on file  . Food insecurity    Worry: Not on file    Inability: Not on file  . Transportation needs    Medical: Not on file    Non-medical: Not on file  Tobacco Use  . Smoking status: Never Smoker  . Smokeless tobacco: Never Used  Substance and Sexual Activity  . Alcohol use: No  . Drug use: No  . Sexual activity: Yes    Birth control/protection: Post-menopausal, Surgical  Lifestyle  . Physical activity    Days per week: 4 days    Minutes per session: 40 min  . Stress: Not on file  Relationships  . Social Herbalist on phone: Not on file    Gets together: Not on file    Attends religious service: Not on file    Active member of club or organization: Not on file    Attends meetings of clubs or organizations: Not on file    Relationship status: Not on file  . Intimate partner violence    Fear of current or ex partner: Not on file    Emotionally abused: Not on file    Physically abused: Not on file    Forced sexual activity: Not on file  Other Topics Concern  . Not on file  Social History Narrative  . Not on file    Allergies:  Allergies  Allergen Reactions  . Prednisone     Make her feel nauseous and "funny in the head"    Medications: Prior to Admission medications   Medication Sig Start Date End Date Taking? Authorizing Provider  bimatoprost (LUMIGAN) 0.03 % ophthalmic solution 1 drop nightly.   Yes [provider]  brimonidine (ALPHAGAN) 0.2 %  ophthalmic solution INT 1 GTT IN OU Q 12 H 12/24/16  Yes [provider]  mirabegron ER (MYRBETRIQ) 50 MG TB24 tablet Take 50 mg by mouth. 06/01/16  Yes [provider]  mometasone (NASONEX) 50 MCG/ACT nasal spray SHAKE LIQUID WELL AND USE 2 SPRAYS IN EACH NOSTRIL EVERY DAY 04/13/16  Yes [provider]  rosuvastatin (CRESTOR) 5 MG tablet TK 1 T PO QD 11/30/16  Yes [provider]  traZODone (DESYREL) 50 MG tablet Take 50 mg by mouth.   Yes [provider]    Physical Exam Vitals: BP 122/80 (BP Location: Right Arm, Patient Position: Sitting, Cuff Size: Normal)   Pulse 60   Ht 5' (1.524 m)  Wt 118 lb (53.5 kg)   BMI 23.05 kg/m   General: petite WF in NAD HEENT: normocephalic, anicteric Neck: no thyroid enlargement, no palpable nodules, no cervical lymphadenopathy  Pulmonary: No increased work of breathing, CTAB Cardiovascular: RRR, without murmur  Breast: Breast symmetrical, no tenderness, no palpable nodules or masses, no skin or nipple retraction present, no nipple discharge.  No axillary, infraclavicular or supraclavicular lymphadenopathy. Abdomen: Soft, non-tender, non-distended.  Umbilicus without lesions.  No hepatomegaly or masses palpable. No evidence of hernia. Genitourinary:  External: Atrophic changes.  Normal urethral meatus, normal Bartholin's and Skene's glands.    Vagina: Decreased rugae, no evidence of prolapse.    Cervix: Grossly normal in appearance, no bleeding, non-tender  Uterus: Anteverted, small, normal shape and consistency, mobile, and non-tender  Adnexa: No adnexal masses, non-tender  Rectal: deferred  Lymphatic: no evidence of inguinal lymphadenopathy Extremities: no edema, erythema, or tenderness Neurologic: Grossly intact Psychiatric: mood appropriate, affect full     Assessment: 63 y.o. [id number] well woman exam Overactive bladder/ Urgency incontinence Decreased sexual desire Episodes of lower abdominal pain/  nausea  Plan:   1) Breast cancer screening - recommend monthly self breast exam and annual mammograms Mammogram was ordered today. Patient to schedule after 05/21/2019  2) Cervical cancer screening - Pap was done. ASCCP guidelines and rational discussed.  Patient opts for yearly screening interval  3)Colon cancer screening-colonoscopy appointment scheduled for 13 July. Advised to talk with GI regarding abdominal pain/ nausea episodes. Consider pelvic ultrasound if GI work up negative.   4) Routine healthcare maintenance including cholesterol and diabetes screening managed by PCP . TDAP given today. RX for Shingrix  5) Discussed calcium and vitamin D3 requirements.  6) Trial of testosterone 2% in PLO gel-rub 0.2 to 0.4 ml on wrist or thigh 3x/week for hypoactive sexual desire.   7) RTO in 1 year and prn.  Dalia Heading, CNM

## 2019-02-19 ENCOUNTER — Ambulatory Visit: Payer: BC Managed Care – PPO | Admitting: Certified Nurse Midwife

## 2019-02-19 ENCOUNTER — Other Ambulatory Visit: Payer: Self-pay

## 2019-02-19 ENCOUNTER — Encounter: Payer: Self-pay | Admitting: Certified Nurse Midwife

## 2019-02-19 VITALS — BP 122/80 | HR 60 | Ht 60.0 in | Wt 118.0 lb

## 2019-02-19 DIAGNOSIS — Z01419 Encounter for gynecological examination (general) (routine) without abnormal findings: Secondary | ICD-10-CM

## 2019-02-19 DIAGNOSIS — R1030 Lower abdominal pain, unspecified: Secondary | ICD-10-CM

## 2019-02-19 DIAGNOSIS — Z124 Encounter for screening for malignant neoplasm of cervix: Secondary | ICD-10-CM

## 2019-02-19 DIAGNOSIS — Z23 Encounter for immunization: Secondary | ICD-10-CM

## 2019-02-19 DIAGNOSIS — F520 Hypoactive sexual desire disorder: Secondary | ICD-10-CM

## 2019-02-19 DIAGNOSIS — Z1239 Encounter for other screening for malignant neoplasm of breast: Secondary | ICD-10-CM

## 2019-02-19 LAB — POCT URINALYSIS DIPSTICK
Appearance: NORMAL
Bilirubin, UA: NEGATIVE
Blood, UA: NEGATIVE
Glucose, UA: NEGATIVE
Ketones, UA: NEGATIVE
Leukocytes, UA: NEGATIVE
Nitrite, UA: NEGATIVE
Odor: NORMAL
Protein, UA: NEGATIVE
Spec Grav, UA: 1.01 (ref 1.010–1.025)
Urobilinogen, UA: 0.2 E.U./dL
pH, UA: 6.5 (ref 5.0–8.0)

## 2019-02-19 MED ORDER — SHINGRIX 50 MCG/0.5ML IM SUSR: INTRAMUSCULAR | 1 refills | Status: DC

## 2019-02-19 NOTE — Patient Instructions (Signed)
Testosterone ointment-use as directed. Call [facility] to see if ready for pick up.

## 2019-02-19 NOTE — Progress Notes (Signed)
TDAP given today IM Right Deltoid. Patient tolerated well.

## 2019-02-23 LAB — IGP,RFX APTIMA HPV ALL PTH

## 2019-05-25 ENCOUNTER — Ambulatory Visit
Admission: RE | Admit: 2019-05-25 | Discharge: 2019-05-25 | Disposition: A | Discharge status: Home / Self Care | Payer: BC Managed Care – PPO | Source: Ambulatory Visit | Attending: Certified Nurse Midwife | Admitting: Certified Nurse Midwife

## 2019-05-25 DIAGNOSIS — Z1239 Encounter for other screening for malignant neoplasm of breast: Secondary | ICD-10-CM | POA: Diagnosis present

## 2019-05-25 DIAGNOSIS — Z1231 Encounter for screening mammogram for malignant neoplasm of breast: Secondary | ICD-10-CM | POA: Insufficient documentation

## 2019-05-25 IMAGING — MG MM DIGITAL SCREENING BILAT W/ TOMO W/ CAD
8 series · 9 of 24 positions shown · non-contrast
Comparison: Previous exam(s).

CLINICAL DATA: Screening.

EXAM:
DIGITAL SCREENING BILATERAL MAMMOGRAM WITH TOMO AND CAD

[R MLO synth-2D]
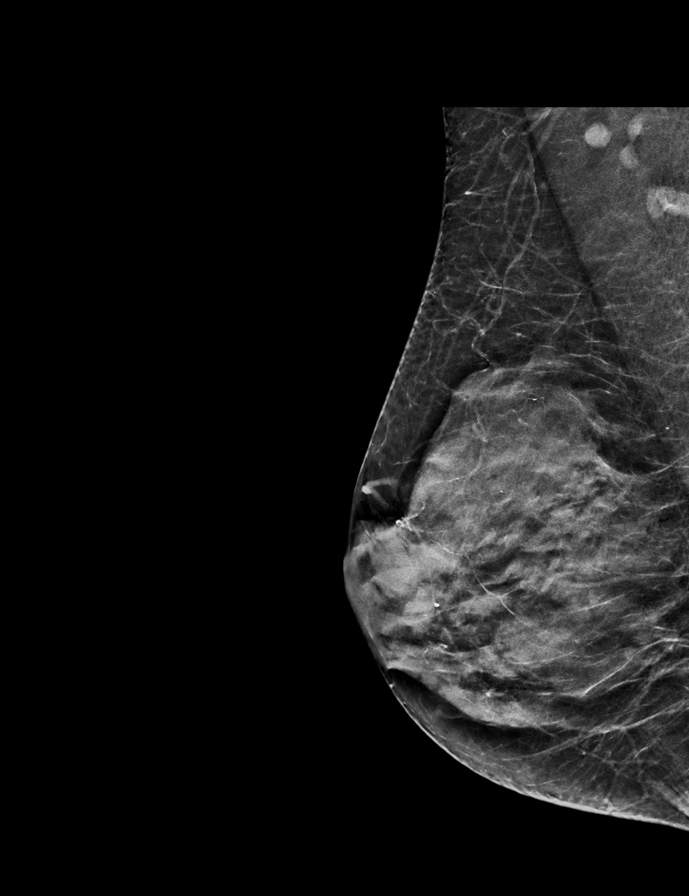

[L MLO synth-2D]
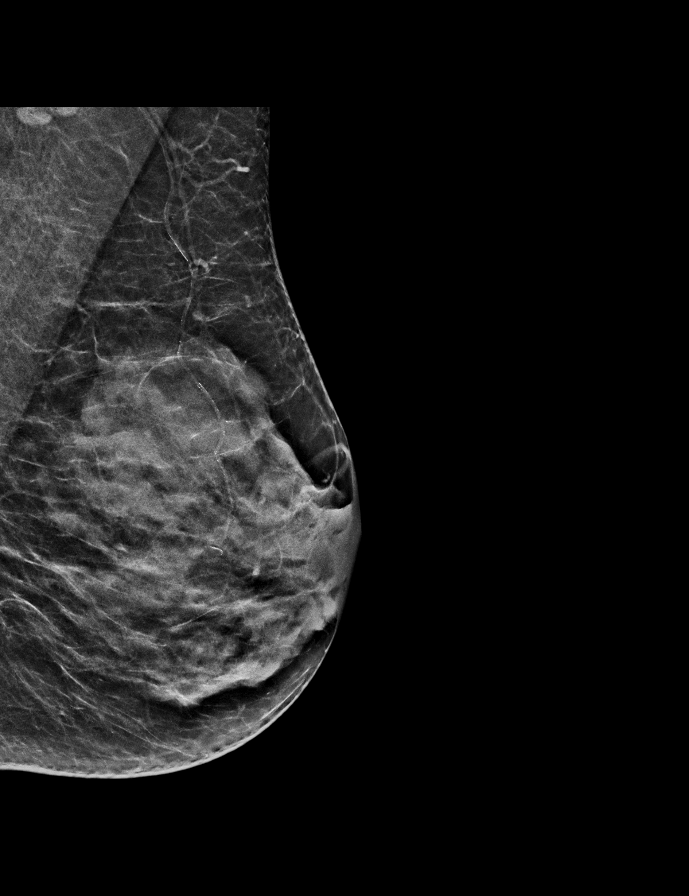

[R CC synth-2D]
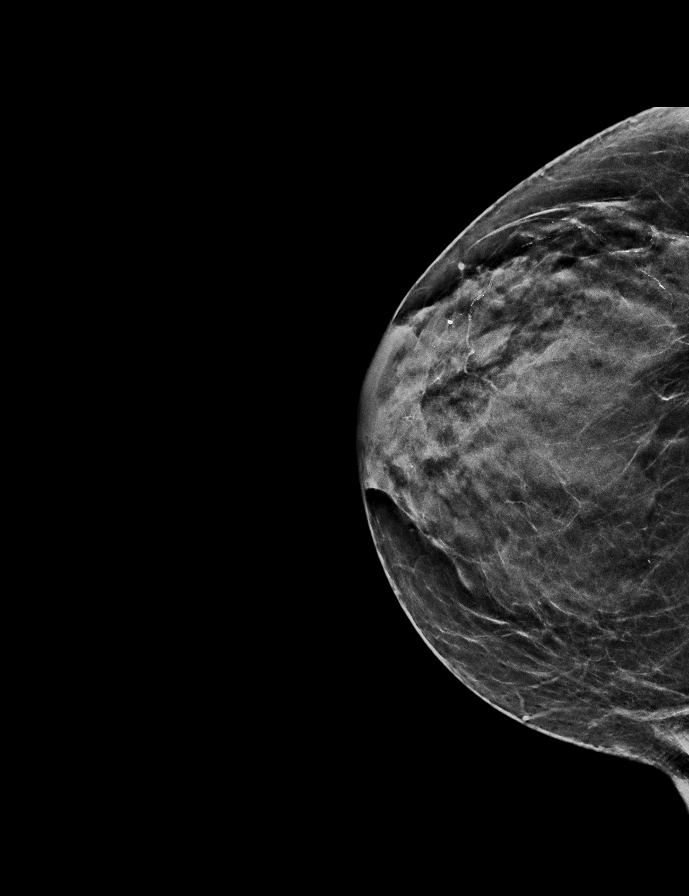

[L CC synth-2D]
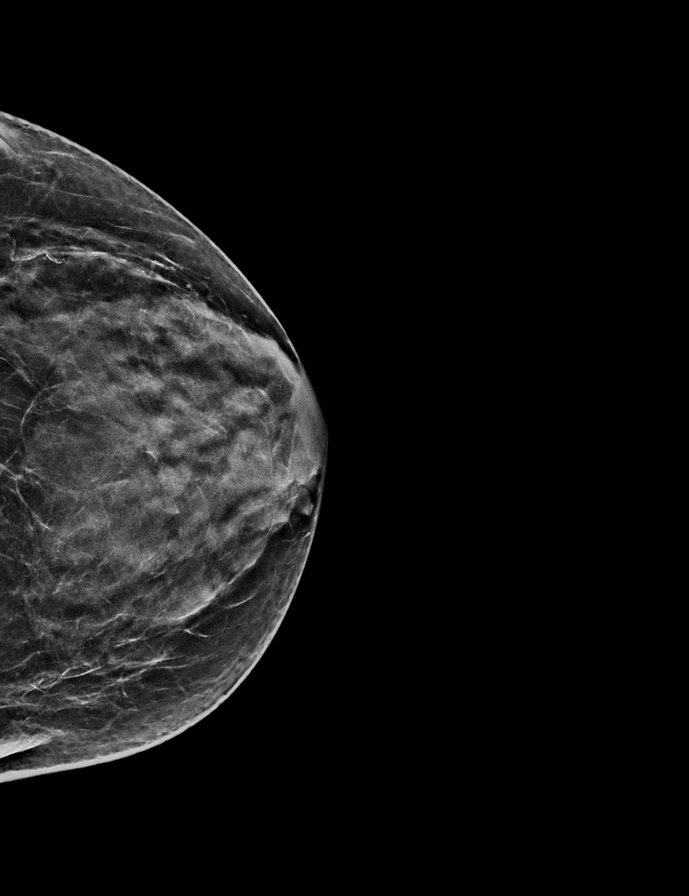

[L MLO tomo · 2 of 47 frames shown]
[frame 16/47]
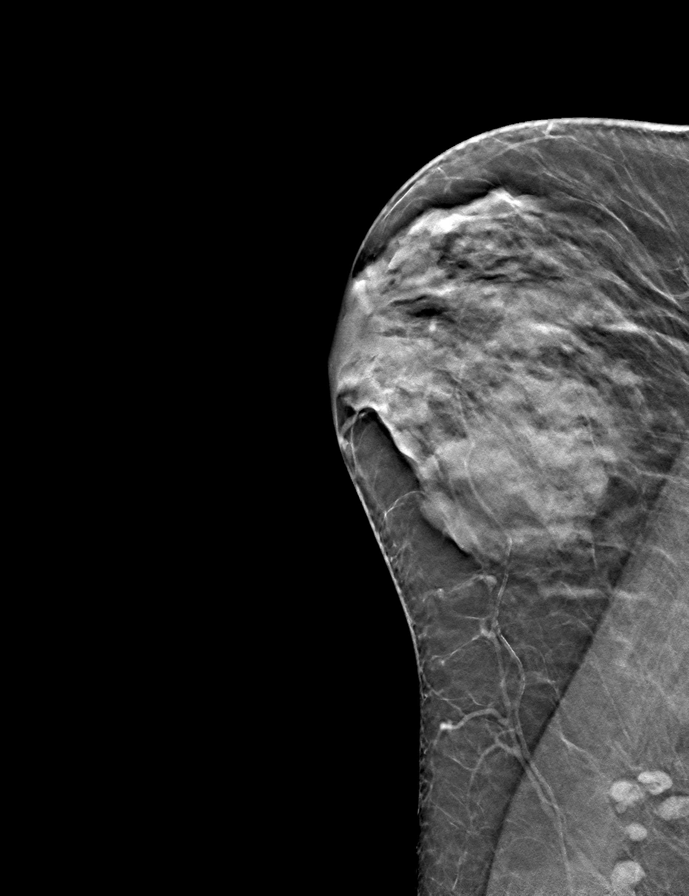
[frame 24/47]
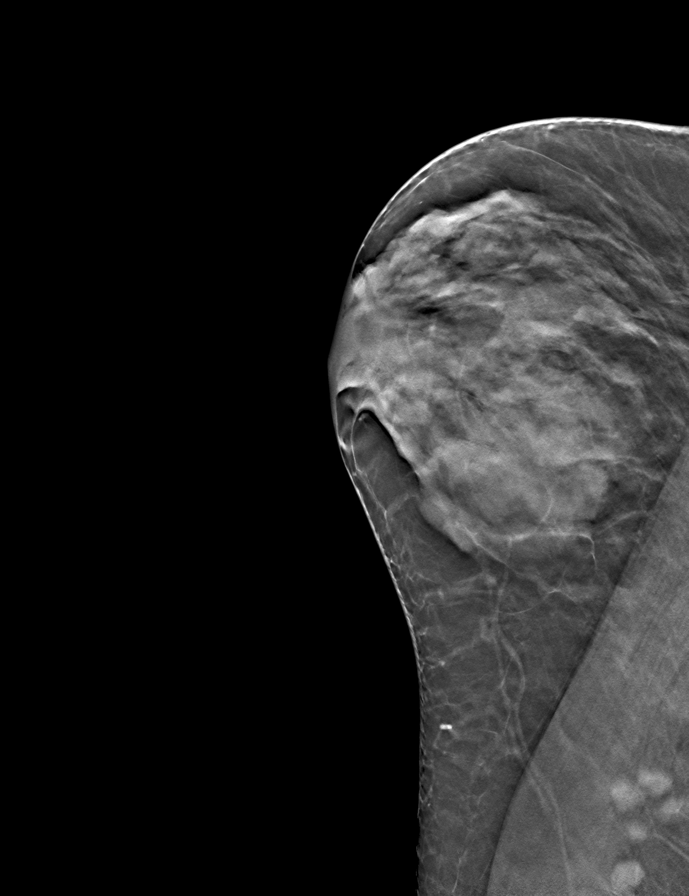

[L CC tomo · tomo slice 23/46.0]
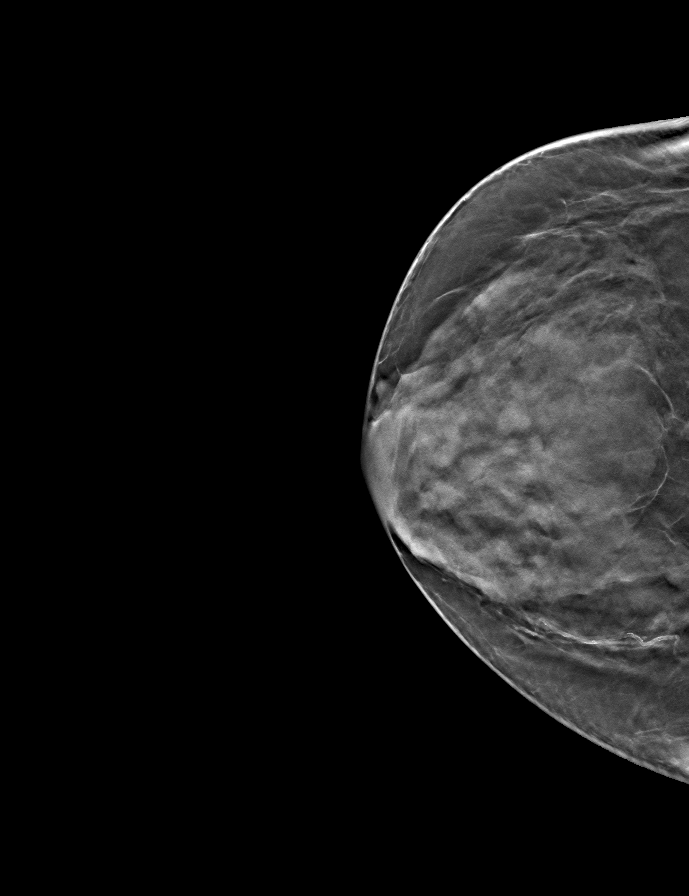

[R MLO tomo · tomo slice 26/51.0]
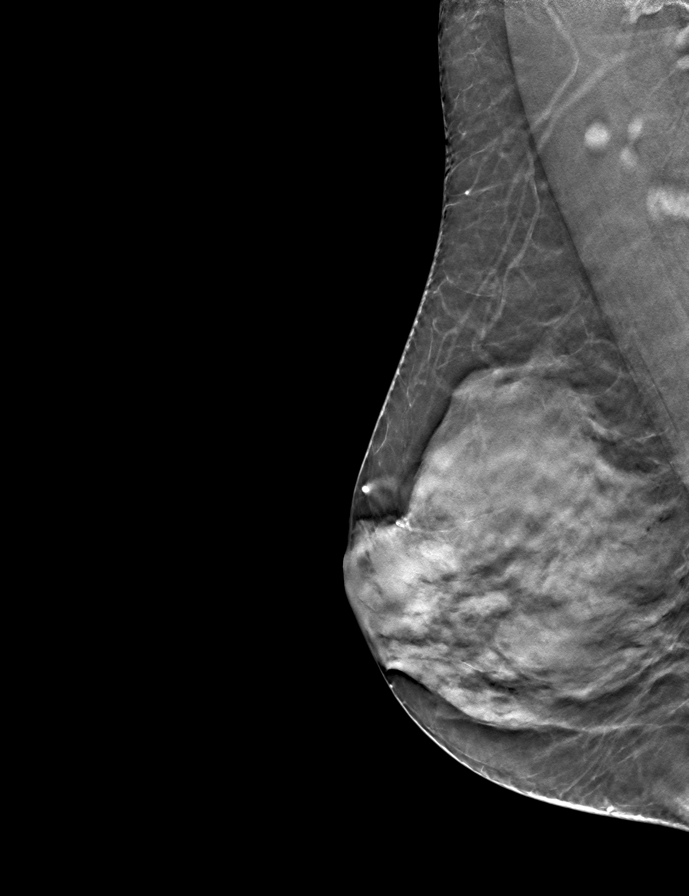

[R CC tomo · tomo slice 24/47.0]
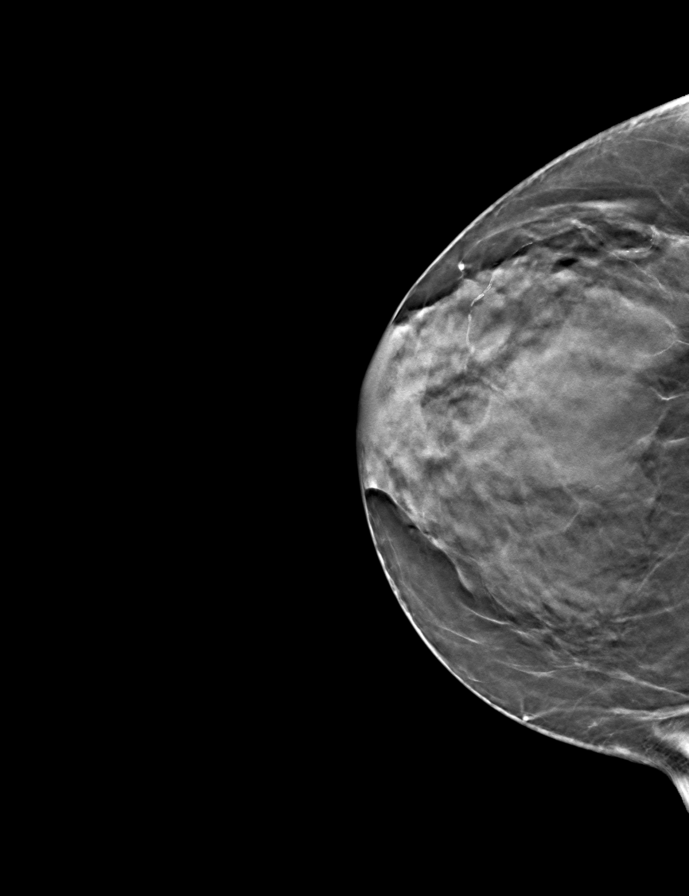

[9 of 24 positions shown; findings below may reference images not displayed]

ACR Breast Density Category d: The breast tissue is extremely dense,
which lowers the sensitivity of mammography
FINDINGS: There are no findings suspicious for malignancy. Images were
processed with CAD.
IMPRESSION: No mammographic evidence of malignancy. A result letter of this
screening mammogram will be mailed directly to the patient.

RECOMMENDATION:
Screening mammogram in one year. (Code:[5I])

BI-RADS CATEGORY  1: Negative.

## 2019-07-08 ENCOUNTER — Other Ambulatory Visit: Payer: Self-pay

## 2019-07-08 ENCOUNTER — Inpatient Hospital Stay: Payer: BC Managed Care – PPO | Admitting: Anesthesiology

## 2019-07-08 ENCOUNTER — Emergency Department: Payer: BC Managed Care – PPO

## 2019-07-08 ENCOUNTER — Encounter
Admission: EM | Disposition: A | Discharge status: Home / Self Care | Payer: Self-pay | Source: Home / Self Care | Attending: General Surgery

## 2019-07-08 ENCOUNTER — Inpatient Hospital Stay: Payer: BC Managed Care – PPO

## 2019-07-08 ENCOUNTER — Inpatient Hospital Stay
Admission: EM | Admit: 2019-07-08 | Discharge: 2019-07-14 | DRG: 329 | Disposition: A | Discharge status: Home / Self Care | Payer: BC Managed Care – PPO | Attending: General Surgery | Admitting: General Surgery

## 2019-07-08 DIAGNOSIS — K460 Unspecified abdominal hernia with obstruction, without gangrene: Secondary | ICD-10-CM | POA: Diagnosis present

## 2019-07-08 DIAGNOSIS — R188 Other ascites: Secondary | ICD-10-CM | POA: Diagnosis present

## 2019-07-08 DIAGNOSIS — E872 Acidosis: Secondary | ICD-10-CM | POA: Diagnosis present

## 2019-07-08 DIAGNOSIS — Z8249 Family history of ischemic heart disease and other diseases of the circulatory system: Secondary | ICD-10-CM

## 2019-07-08 DIAGNOSIS — K56609 Unspecified intestinal obstruction, unspecified as to partial versus complete obstruction: Secondary | ICD-10-CM

## 2019-07-08 DIAGNOSIS — K449 Diaphragmatic hernia without obstruction or gangrene: Secondary | ICD-10-CM | POA: Diagnosis present

## 2019-07-08 DIAGNOSIS — E785 Hyperlipidemia, unspecified: Secondary | ICD-10-CM | POA: Diagnosis present

## 2019-07-08 DIAGNOSIS — E039 Hypothyroidism, unspecified: Secondary | ICD-10-CM | POA: Diagnosis present

## 2019-07-08 DIAGNOSIS — K565 Intestinal adhesions [bands], unspecified as to partial versus complete obstruction: Secondary | ICD-10-CM | POA: Diagnosis present

## 2019-07-08 DIAGNOSIS — K567 Ileus, unspecified: Secondary | ICD-10-CM | POA: Diagnosis not present

## 2019-07-08 DIAGNOSIS — Z85828 Personal history of other malignant neoplasm of skin: Secondary | ICD-10-CM

## 2019-07-08 DIAGNOSIS — N3281 Overactive bladder: Secondary | ICD-10-CM | POA: Diagnosis present

## 2019-07-08 DIAGNOSIS — Z20828 Contact with and (suspected) exposure to other viral communicable diseases: Secondary | ICD-10-CM | POA: Diagnosis present

## 2019-07-08 DIAGNOSIS — Z888 Allergy status to other drugs, medicaments and biological substances status: Secondary | ICD-10-CM | POA: Diagnosis not present

## 2019-07-08 DIAGNOSIS — F329 Major depressive disorder, single episode, unspecified: Secondary | ICD-10-CM | POA: Diagnosis present

## 2019-07-08 DIAGNOSIS — Z833 Family history of diabetes mellitus: Secondary | ICD-10-CM

## 2019-07-08 DIAGNOSIS — K55069 Acute infarction of intestine, part and extent unspecified: Secondary | ICD-10-CM | POA: Diagnosis present

## 2019-07-08 DIAGNOSIS — Q430 Meckel's diverticulum (displaced) (hypertrophic): Secondary | ICD-10-CM

## 2019-07-08 HISTORY — PX: LAPAROTOMY: SHX154

## 2019-07-08 LAB — CBC
HCT: 38.1 % (ref 36.0–46.0)
HCT: 41.9 % (ref 36.0–46.0)
Hemoglobin: 13 g/dL (ref 12.0–15.0)
Hemoglobin: 14.1 g/dL (ref 12.0–15.0)
MCH: 30.7 pg (ref 26.0–34.0)
MCH: 30.8 pg (ref 26.0–34.0)
MCHC: 34.1 g/dL (ref 30.0–36.0)
MCHC: 33.7 g/dL (ref 30.0–36.0)
MCV: 89.9 fL (ref 80.0–100.0)
MCV: 91.5 fL (ref 80.0–100.0)
Platelets: 181 10*3/uL (ref 150–400)
Platelets: 219 10*3/uL (ref 150–400)
RBC: 4.24 MIL/uL (ref 3.87–5.11)
RBC: 4.58 MIL/uL (ref 3.87–5.11)
RDW: 11.7 % (ref 11.5–15.5)
RDW: 11.6 % (ref 11.5–15.5)
WBC: 5.5 10*3/uL (ref 4.0–10.5)
WBC: 13.9 10*3/uL — ABNORMAL HIGH (ref 4.0–10.5)
nRBC: 0 % (ref 0.0–0.2)
nRBC: 0 % (ref 0.0–0.2)

## 2019-07-08 LAB — URINALYSIS, COMPLETE (UACMP) WITH MICROSCOPIC
Bacteria, UA: NONE SEEN
Bilirubin Urine: NEGATIVE
Glucose, UA: 50 mg/dL — AB
Ketones, ur: 5 mg/dL — AB
Leukocytes,Ua: NEGATIVE
Nitrite: NEGATIVE
Protein, ur: NEGATIVE mg/dL
Specific Gravity, Urine: 1.042 — ABNORMAL HIGH (ref 1.005–1.030)
pH: 6 (ref 5.0–8.0)

## 2019-07-08 LAB — CREATININE, SERUM
Creatinine, Ser: 0.76 mg/dL (ref 0.44–1.00)
GFR calc Af Amer: >60 mL/min (ref >60)
GFR calc non Af Amer: >60 mL/min (ref >60)

## 2019-07-08 LAB — COMPREHENSIVE METABOLIC PANEL
ALT: 16 U/L (ref 0–44)
AST: 28 U/L (ref 15–41)
Albumin: 4 g/dL (ref 3.5–5.0)
Alkaline Phosphatase: 45 U/L (ref 38–126)
Anion gap: 9 (ref 5–15)
BUN: 16 mg/dL (ref 8–23)
CO2: 27 mmol/L (ref 22–32)
Calcium: 9.1 mg/dL (ref 8.9–10.3)
Chloride: 103 mmol/L (ref 98–111)
Creatinine, Ser: 0.9 mg/dL (ref 0.44–1.00)
GFR calc Af Amer: >60 mL/min (ref >60)
GFR calc non Af Amer: >60 mL/min (ref >60)
Glucose, Bld: 139 mg/dL — ABNORMAL HIGH (ref 70–99)
Potassium: 4 mmol/L (ref 3.5–5.1)
Sodium: 139 mmol/L (ref 135–145)
Total Bilirubin: 0.7 mg/dL (ref 0.3–1.2)
Total Protein: 6.6 g/dL (ref 6.5–8.1)

## 2019-07-08 LAB — LIPASE, BLOOD: Lipase: 25 U/L (ref 11–51)

## 2019-07-08 LAB — HIV ANTIBODY (ROUTINE TESTING W REFLEX): HIV Screen 4th Generation wRfx: NONREACTIVE

## 2019-07-08 LAB — LACTIC ACID, PLASMA: Lactic Acid, Venous: 4.5 mmol/L (ref 0.5–1.9)

## 2019-07-08 LAB — SARS CORONAVIRUS 2 BY RT PCR (HOSPITAL ORDER, PERFORMED IN ~~LOC~~ HOSPITAL LAB): SARS Coronavirus 2: NEGATIVE

## 2019-07-08 IMAGING — DX DG ABD PORTABLE 1V
1 series · 1 of 1 positions shown · non-contrast
Comparison: Abdomen and pelvis CT obtained earlier today.

CLINICAL DATA: Status post nasogastric tube placement.

EXAM:
PORTABLE ABDOMEN - 1 VIEW

[abdomen supine]
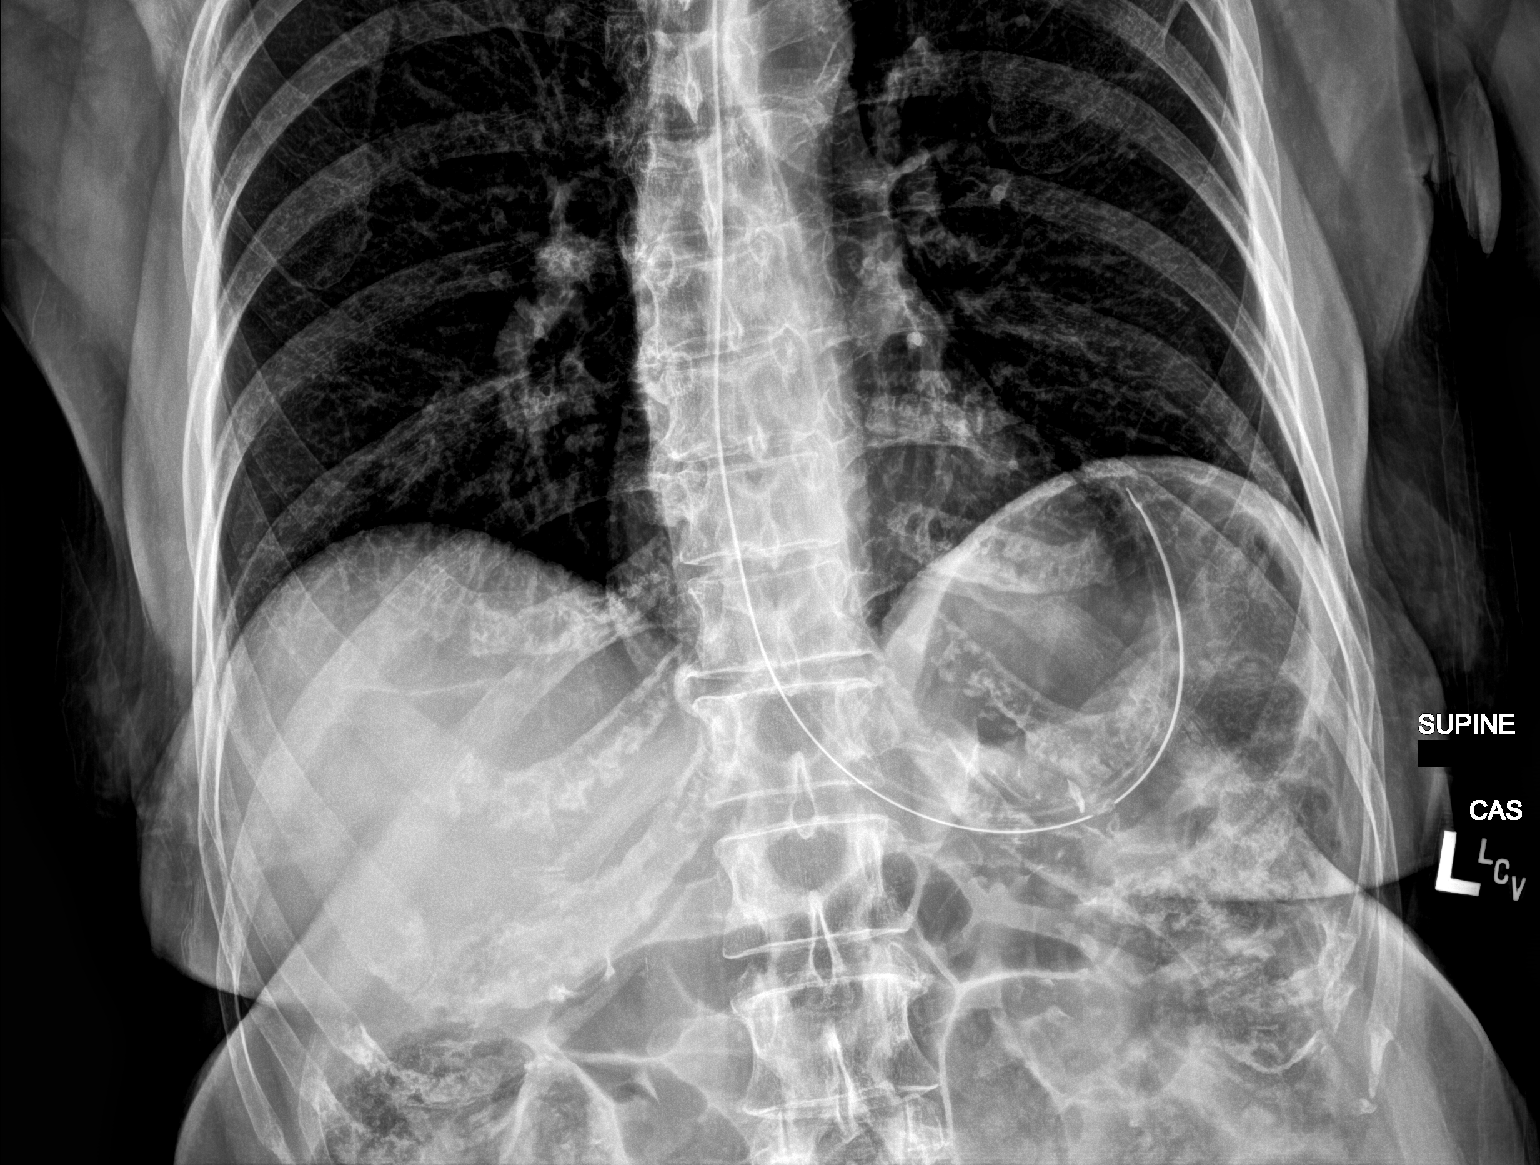

[1 of 1 positions shown; findings below may reference images not displayed]

FINDINGS: Interval nasogastric tube with its tip and side hole in the proximal
stomach. The upper abdominal bowel gas pattern is normal. Thoracic
spine and mild upper lumbar spine degenerative changes.
IMPRESSION: Nasogastric tube tip and side hole in the proximal stomach.

## 2019-07-08 IMAGING — CT CT ABD-PELV W/ CM
2 of 5 series · 15 of 46 positions shown, 17 images · IV contrast (omnipaque)
Comparison: None.

CLINICAL DATA: Acute, diffuse abdominal pain radiating to the back
with associated nausea and vomiting since [DATE] a.m. today.

EXAM:
CT ABDOMEN AND PELVIS WITH CONTRAST
TECHNIQUE: Multidetector CT imaging of the abdomen and pelvis was performed
using the standard protocol following bolus administration of
intravenous contrast.
CONTRAST:  75mL OMNIPAQUE IOHEXOL 300 MG/ML  SOLN

[Series 2: routine abd/pel with · axial · 0.67mm/px · z∈[-444,-84]mm · 12 of 82 slices shown, 14 images]
[im 5/82  soft-tissue]
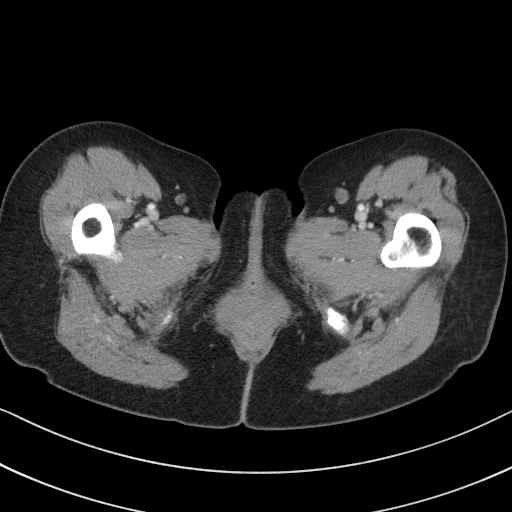
[im 5/82  bone]
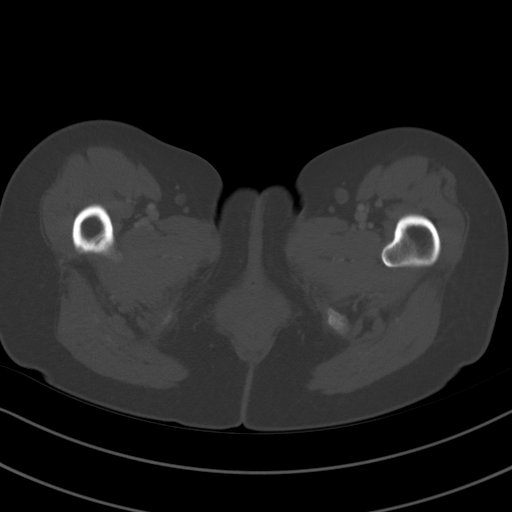
[im 13/82  soft-tissue]
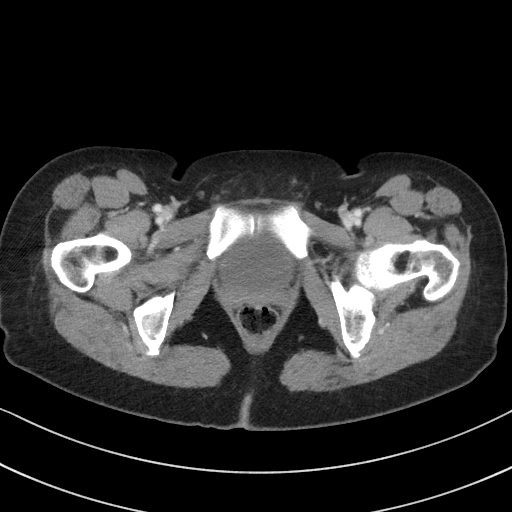
[im 18/82  soft-tissue]
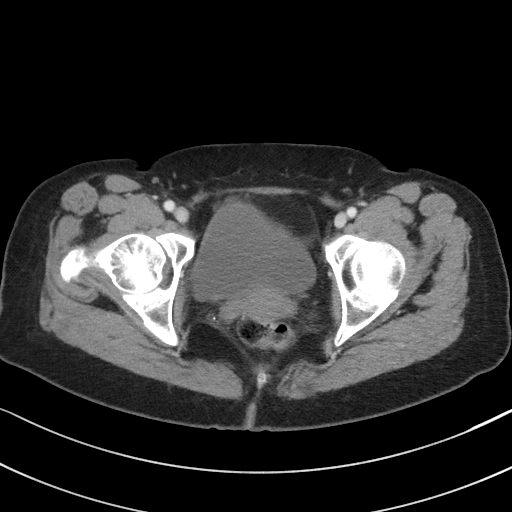
[im 26/82  soft-tissue]
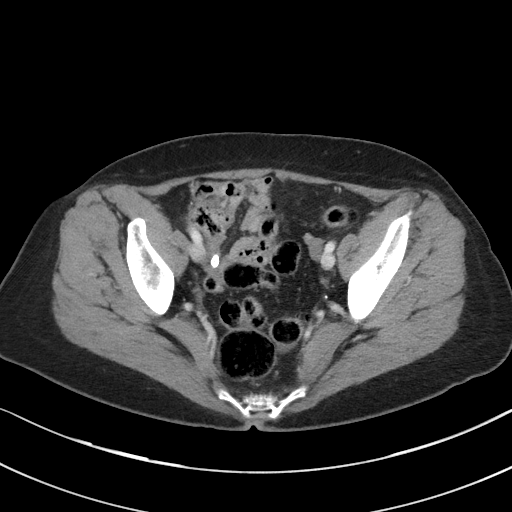
[im 30/82  soft-tissue]
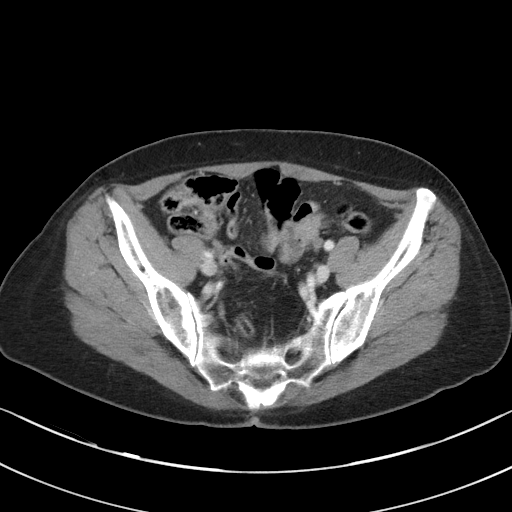
[im 39/82  soft-tissue]
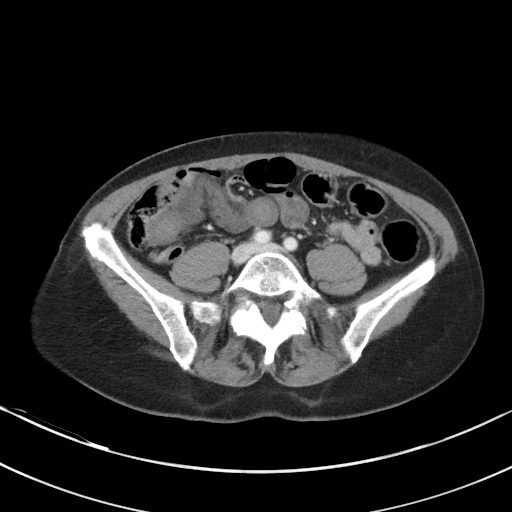
[im 43/82  soft-tissue]
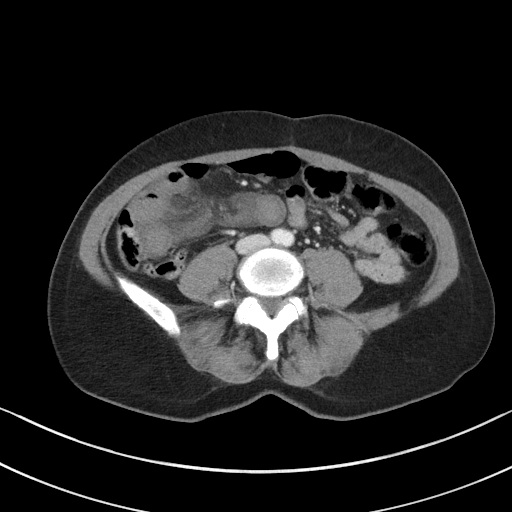
[im 52/82  soft-tissue]
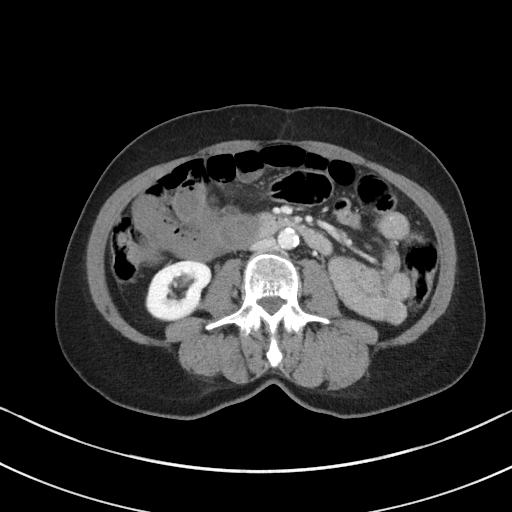
[im 56/82  soft-tissue]
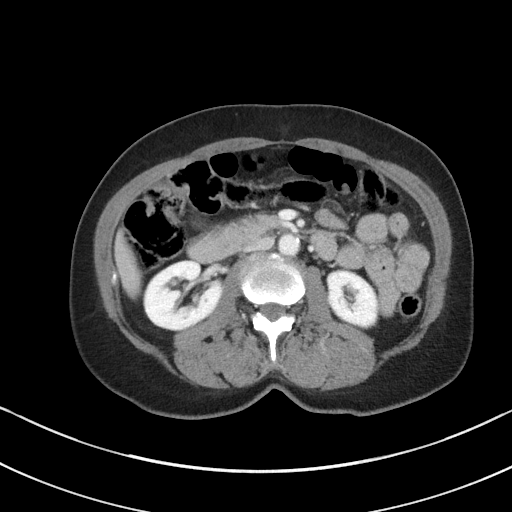
[im 56/82  bone]
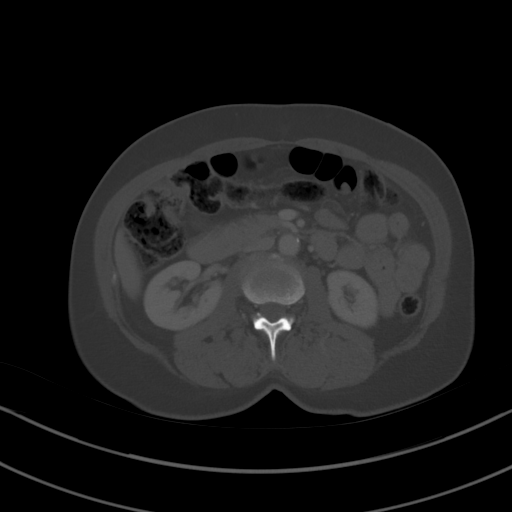
[im 64/82  soft-tissue]
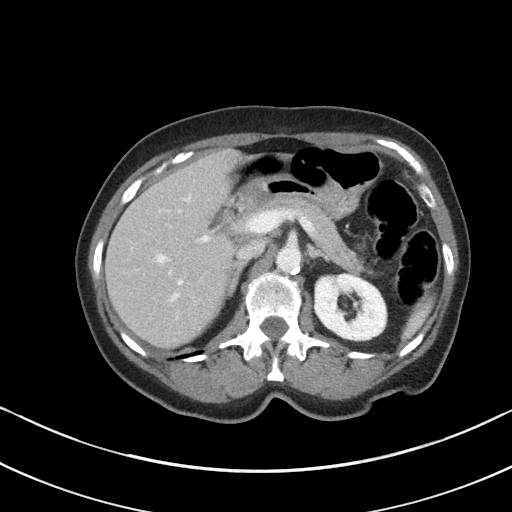
[im 69/82  soft-tissue]
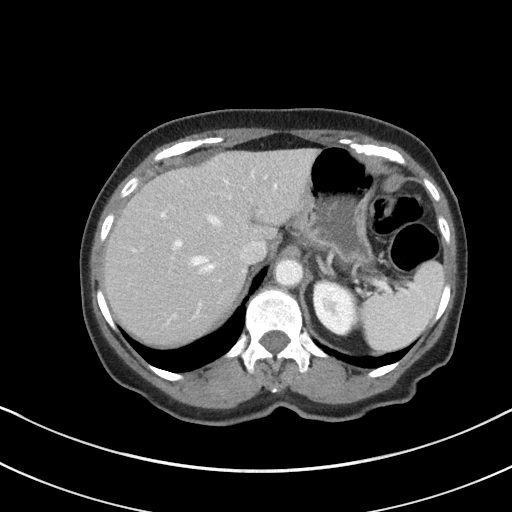
[im 77/82  soft-tissue]
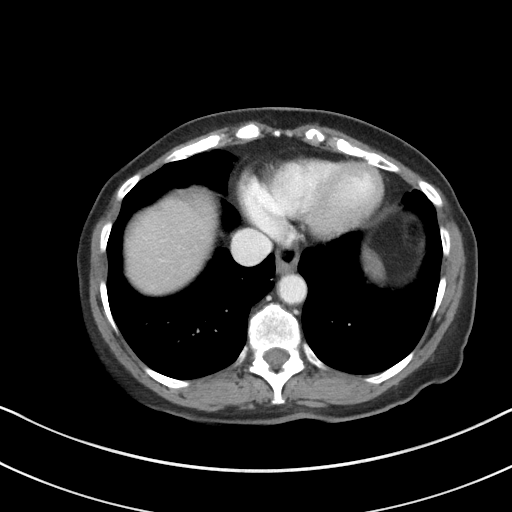

[Series 5: coronal st · coronal · 0.62mm/px · 3 of 75 slices shown]
[im 25/75  soft-tissue]
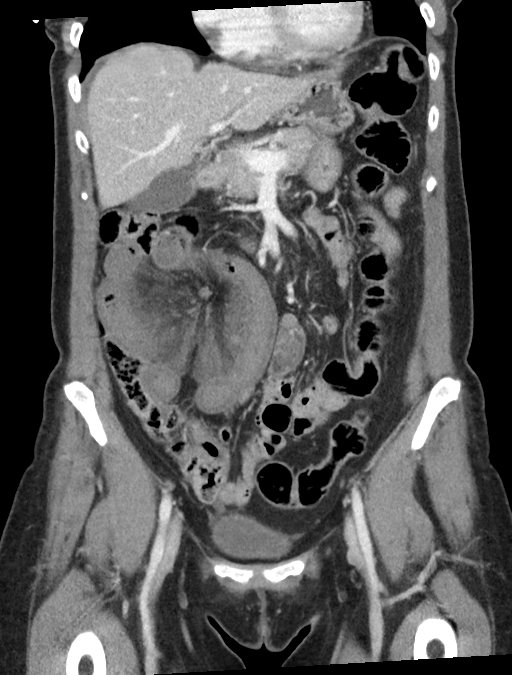
[im 33/75  soft-tissue]
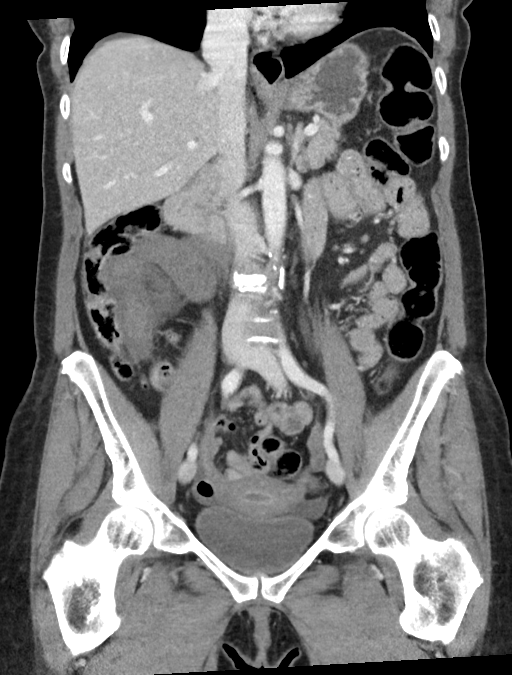
[im 42/75  soft-tissue]
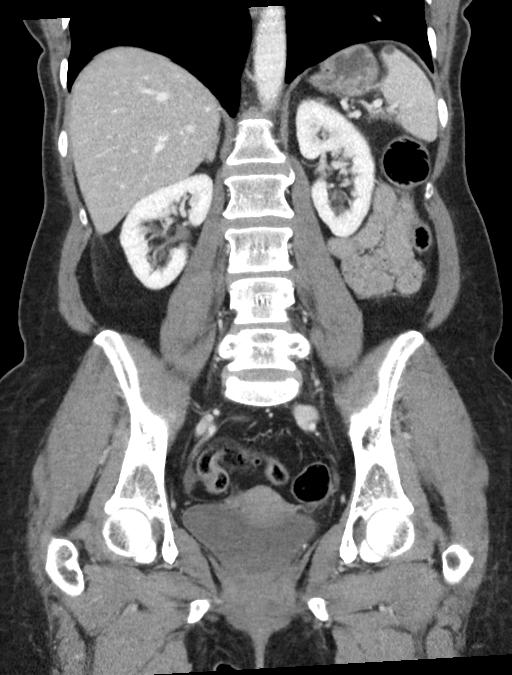

[15 of 46 positions shown; findings below may reference images not displayed]

FINDINGS: Lower chest: Unremarkable.

Hepatobiliary: 2 tiny probable liver cysts. Mild gallbladder wall
thickening and minimal calcification in the distal fundus. No
visible gallstones.

Pancreas: Unremarkable. No pancreatic ductal dilatation or
surrounding inflammatory changes.

Spleen: Small cyst. Otherwise, normal.

Adrenals/Urinary Tract: Adrenal glands are unremarkable. Kidneys are
normal, without renal calculi, focal lesion, or hydronephrosis.
Bladder is unremarkable.

Stomach/Bowel: There is an oval collection of dilated small bowel
loops diffuse low density wall thickening and extensive mesenteric
edema in the right mid to lower abdomen. These converge to a point
in the mesentery in the right mid abdomen. No pneumatosis seen.

The remainder of the small bowel, colon and appendix have normal
appearances. There is a patulous esophageal ampulla or small hiatal
hernia. The remainder of the stomach has a normal appearance.

Vascular/Lymphatic: Atheromatous arterial calcifications without
aneurysm. No enlarged lymph nodes.

Reproductive: Uterus and bilateral adnexa are unremarkable.

Other: Small amount of free peritoneal fluid. No free peritoneal
air.

Musculoskeletal: Lower thoracic and minimal lumbar spine
degenerative changes.
IMPRESSION: 1. Closed loop small bowel obstruction converging to a point in the
mesentery in the right mid to lower abdomen with associated
extensive bowel wall and mesenteric edema. This is compatible with a
closed loop obstruction associated with an internal hernia or
adhesion with extensive small bowel and mesenteric ischemia.
2. Small amount of free peritoneal fluid.
3. Mild gallbladder wall thickening and minimal calcification in the
distal fundus. This could be due to adenomyomatosis. The
calcification could also be early changes of porcelain gallbladder,
which is associated with gallbladder carcinoma.
4. Patulous esophageal ampulla or small hiatal hernia.

Critical Value/emergent results were called by telephone at the time
SALH , who verbally acknowledged these results.

## 2019-07-08 SURGERY — Surgical Case
Anesthesia: *Unknown

## 2019-07-08 SURGERY — LAPAROTOMY, EXPLORATORY
Anesthesia: General

## 2019-07-08 MED ORDER — LACTATED RINGERS IV BOLUS
1000.0000 mL | Freq: Once | INTRAVENOUS | Status: AC
  Administered 2019-07-08: 1000 mL via INTRAVENOUS

## 2019-07-08 MED ORDER — LIDOCAINE HCL (PF) 2 % IJ SOLN
INTRAMUSCULAR | Status: AC
  Filled 2019-07-08: qty 10

## 2019-07-08 MED ORDER — LIDOCAINE HCL (CARDIAC) PF 100 MG/5ML IV SOSY
PREFILLED_SYRINGE | INTRAVENOUS | Status: DC | PRN
  Administered 2019-07-08: 60 mg via INTRAVENOUS

## 2019-07-08 MED ORDER — FENTANYL CITRATE (PF) 100 MCG/2ML IJ SOLN
INTRAMUSCULAR | Status: AC
  Filled 2019-07-08: qty 2

## 2019-07-08 MED ORDER — LACTATED RINGERS IV SOLN
INTRAVENOUS | Status: DC | PRN
  Administered 2019-07-08: 20:00:00 via INTRAVENOUS

## 2019-07-08 MED ORDER — NETARSUDIL-LATANOPROST 0.02-0.005 % OP SOLN
1.0000 [drp] | Freq: Every day | OPHTHALMIC | Status: DC
  Filled 2019-07-08: qty 1

## 2019-07-08 MED ORDER — DEXTROSE IN LACTATED RINGERS 5 % IV SOLN
INTRAVENOUS | Status: DC
  Administered 2019-07-08 (×3): via INTRAVENOUS

## 2019-07-08 MED ORDER — ROCURONIUM BROMIDE 50 MG/5ML IV SOLN
INTRAVENOUS | Status: AC
  Filled 2019-07-08: qty 1

## 2019-07-08 MED ORDER — ONDANSETRON HCL 4 MG/2ML IJ SOLN: 4.0000 mg | Freq: Four times a day (QID) | INTRAMUSCULAR | Status: DC | PRN

## 2019-07-08 MED ORDER — SODIUM CHLORIDE (PF) 0.9 % IJ SOLN
INTRAMUSCULAR | Status: DC | PRN
  Administered 2019-07-08: 10 mL

## 2019-07-08 MED ORDER — MIDAZOLAM HCL 2 MG/2ML IJ SOLN
INTRAMUSCULAR | Status: AC
  Filled 2019-07-08: qty 2

## 2019-07-08 MED ORDER — ENOXAPARIN SODIUM 40 MG/0.4ML ~~LOC~~ SOLN
40.0000 mg | SUBCUTANEOUS | Status: DC
  Administered 2019-07-09 – 2019-07-13 (×5): 40 mg via SUBCUTANEOUS
  Filled 2019-07-08 (×5): qty 0.4

## 2019-07-08 MED ORDER — ACETAMINOPHEN 500 MG PO TABS
1000.0000 mg | ORAL_TABLET | Freq: Four times a day (QID) | ORAL | Status: DC
  Administered 2019-07-08 – 2019-07-10 (×9): 1000 mg via ORAL
  Filled 2019-07-08 (×11): qty 2

## 2019-07-08 MED ORDER — PHENYLEPHRINE HCL (PRESSORS) 10 MG/ML IV SOLN
INTRAVENOUS | Status: DC | PRN
  Administered 2019-07-08: 100 ug via INTRAVENOUS
  Administered 2019-07-08: 200 ug via INTRAVENOUS
  Administered 2019-07-08: 100 ug via INTRAVENOUS
  Administered 2019-07-08: 250 ug via INTRAVENOUS
  Administered 2019-07-08 (×2): 100 ug via INTRAVENOUS
  Administered 2019-07-08: 200 ug via INTRAVENOUS

## 2019-07-08 MED ORDER — HYDROMORPHONE HCL 1 MG/ML IJ SOLN
0.5000 mg | INTRAMUSCULAR | Status: DC | PRN
  Administered 2019-07-08: 0.5 mg via INTRAVENOUS
  Filled 2019-07-08: qty 0.5

## 2019-07-08 MED ORDER — LACTATED RINGERS IV SOLN
INTRAVENOUS | Status: DC | PRN
  Administered 2019-07-08 (×2): via INTRAVENOUS

## 2019-07-08 MED ORDER — OXYCODONE-ACETAMINOPHEN 5-325 MG PO TABS
1.0000 | ORAL_TABLET | ORAL | Status: DC | PRN
  Administered 2019-07-08: 1 via ORAL
  Filled 2019-07-08: qty 1

## 2019-07-08 MED ORDER — SUCCINYLCHOLINE CHLORIDE 20 MG/ML IJ SOLN
INTRAMUSCULAR | Status: DC | PRN
  Administered 2019-07-08: 100 mg via INTRAVENOUS

## 2019-07-08 MED ORDER — DEXAMETHASONE SODIUM PHOSPHATE 10 MG/ML IJ SOLN
INTRAMUSCULAR | Status: DC | PRN
  Administered 2019-07-08: 4 mg via INTRAVENOUS

## 2019-07-08 MED ORDER — ONDANSETRON 4 MG PO TBDP
4.0000 mg | ORAL_TABLET | Freq: Once | ORAL | Status: AC
  Administered 2019-07-08: 4 mg via ORAL
  Filled 2019-07-08: qty 1

## 2019-07-08 MED ORDER — MORPHINE SULFATE (PF) 4 MG/ML IV SOLN
4.0000 mg | Freq: Once | INTRAVENOUS | Status: AC
  Administered 2019-07-08: 4 mg via INTRAVENOUS
  Filled 2019-07-08: qty 1

## 2019-07-08 MED ORDER — PROMETHAZINE HCL 25 MG/ML IJ SOLN: 6.2500 mg | INTRAMUSCULAR | Status: DC | PRN

## 2019-07-08 MED ORDER — IOHEXOL 300 MG/ML  SOLN
75.0000 mL | Freq: Once | INTRAMUSCULAR | Status: AC | PRN
  Administered 2019-07-08: 75 mL via INTRAVENOUS

## 2019-07-08 MED ORDER — PROPOFOL 10 MG/ML IV BOLUS
INTRAVENOUS | Status: AC
  Filled 2019-07-08: qty 20

## 2019-07-08 MED ORDER — EPHEDRINE SULFATE 50 MG/ML IJ SOLN
INTRAMUSCULAR | Status: DC | PRN
  Administered 2019-07-08: 5 mg via INTRAVENOUS

## 2019-07-08 MED ORDER — DEXAMETHASONE SODIUM PHOSPHATE 10 MG/ML IJ SOLN
INTRAMUSCULAR | Status: AC
  Filled 2019-07-08: qty 1

## 2019-07-08 MED ORDER — BUPIVACAINE LIPOSOME 1.3 % IJ SUSP
INTRAMUSCULAR | Status: AC
  Filled 2019-07-08: qty 20

## 2019-07-08 MED ORDER — ROCURONIUM BROMIDE 100 MG/10ML IV SOLN
INTRAVENOUS | Status: DC | PRN
  Administered 2019-07-08: 40 mg via INTRAVENOUS
  Administered 2019-07-08: 10 mg via INTRAVENOUS

## 2019-07-08 MED ORDER — ONDANSETRON 4 MG PO TBDP: 4.0000 mg | ORAL_TABLET | Freq: Four times a day (QID) | ORAL | Status: DC | PRN

## 2019-07-08 MED ORDER — SODIUM CHLORIDE FLUSH 0.9 % IV SOLN
INTRAVENOUS | Status: AC
  Filled 2019-07-08: qty 40

## 2019-07-08 MED ORDER — HYDROMORPHONE HCL 1 MG/ML IJ SOLN
0.5000 mg | Freq: Once | INTRAMUSCULAR | Status: AC
  Administered 2019-07-08: 0.5 mg via INTRAVENOUS
  Filled 2019-07-08: qty 1

## 2019-07-08 MED ORDER — HYDROMORPHONE HCL 1 MG/ML IJ SOLN: 0.2500 mg | INTRAMUSCULAR | Status: DC | PRN

## 2019-07-08 MED ORDER — SUCCINYLCHOLINE CHLORIDE 20 MG/ML IJ SOLN
INTRAMUSCULAR | Status: AC
  Filled 2019-07-08: qty 1

## 2019-07-08 MED ORDER — FENTANYL CITRATE (PF) 100 MCG/2ML IJ SOLN
INTRAMUSCULAR | Status: DC | PRN
  Administered 2019-07-08: 50 ug via INTRAVENOUS

## 2019-07-08 MED ORDER — KETOROLAC TROMETHAMINE 15 MG/ML IJ SOLN
15.0000 mg | Freq: Four times a day (QID) | INTRAMUSCULAR | Status: AC | PRN
  Administered 2019-07-08: 15 mg via INTRAVENOUS
  Filled 2019-07-08 (×2): qty 1

## 2019-07-08 MED ORDER — BUPIVACAINE HCL (PF) 0.5 % IJ SOLN
INTRAMUSCULAR | Status: AC
  Filled 2019-07-08: qty 30

## 2019-07-08 MED ORDER — SODIUM CHLORIDE 0.9% FLUSH: 3.0000 mL | Freq: Once | INTRAVENOUS | Status: DC

## 2019-07-08 MED ORDER — FLUMAZENIL 0.5 MG/5ML IV SOLN
INTRAVENOUS | Status: DC | PRN
  Administered 2019-07-08: 0.5 mg via INTRAVENOUS

## 2019-07-08 MED ORDER — SUGAMMADEX SODIUM 200 MG/2ML IV SOLN
INTRAVENOUS | Status: AC
  Filled 2019-07-08: qty 2

## 2019-07-08 MED ORDER — BUPIVACAINE LIPOSOME 1.3 % IJ SUSP
INTRAMUSCULAR | Status: DC | PRN
  Administered 2019-07-08: 50 mL

## 2019-07-08 MED ORDER — ONDANSETRON HCL 4 MG/2ML IJ SOLN
INTRAMUSCULAR | Status: AC
  Filled 2019-07-08: qty 2

## 2019-07-08 MED ORDER — PIPERACILLIN-TAZOBACTAM 3.375 G IVPB
3.3750 g | Freq: Three times a day (TID) | INTRAVENOUS | Status: DC
  Administered 2019-07-08 – 2019-07-14 (×17): 3.375 g via INTRAVENOUS
  Filled 2019-07-08 (×17): qty 50

## 2019-07-08 MED ORDER — MIDAZOLAM HCL 2 MG/2ML IJ SOLN
INTRAMUSCULAR | Status: DC | PRN
  Administered 2019-07-08: 2 mg via INTRAVENOUS

## 2019-07-08 MED ORDER — PHENYLEPHRINE HCL (PRESSORS) 10 MG/ML IV SOLN
INTRAVENOUS | Status: AC
  Filled 2019-07-08: qty 1

## 2019-07-08 MED ORDER — ONDANSETRON HCL 4 MG/2ML IJ SOLN
4.0000 mg | Freq: Once | INTRAMUSCULAR | Status: AC
  Administered 2019-07-08: 4 mg via INTRAVENOUS
  Filled 2019-07-08: qty 2

## 2019-07-08 MED ORDER — PROPOFOL 10 MG/ML IV BOLUS
INTRAVENOUS | Status: DC | PRN
  Administered 2019-07-08: 500 mg via INTRAVENOUS

## 2019-07-08 MED ORDER — FLUMAZENIL 0.5 MG/5ML IV SOLN
INTRAVENOUS | Status: AC
  Filled 2019-07-08: qty 5

## 2019-07-08 MED ORDER — DEXTROSE-NACL 5-0.9 % IV SOLN
INTRAVENOUS | Status: DC
  Administered 2019-07-08: 19:00:00 via INTRAVENOUS

## 2019-07-08 MED ORDER — SUGAMMADEX SODIUM 200 MG/2ML IV SOLN
INTRAVENOUS | Status: DC | PRN
  Administered 2019-07-08: 200 mg via INTRAVENOUS

## 2019-07-08 MED ORDER — CHLORHEXIDINE GLUCONATE CLOTH 2 % EX PADS
6.0000 | MEDICATED_PAD | Freq: Every day | CUTANEOUS | Status: DC
  Administered 2019-07-09: 6 via TOPICAL

## 2019-07-08 MED ORDER — ONDANSETRON HCL 4 MG/2ML IJ SOLN
INTRAMUSCULAR | Status: DC | PRN
  Administered 2019-07-08: 4 mg via INTRAVENOUS

## 2019-07-08 SURGICAL SUPPLY — 39 items
CANISTER SUCT 1200ML W/VALVE (MISCELLANEOUS) ×2 IMPLANT
CHLORAPREP W/TINT 26 (MISCELLANEOUS) ×2 IMPLANT
COVER WAND RF STERILE (DRAPES) ×2 IMPLANT
DRAPE LAPAROTOMY 100X77 ABD (DRAPES) ×2 IMPLANT
DRAPE UTILITY 15X26 TOWEL STRL (DRAPES) ×2 IMPLANT
DRSG TELFA 4X14 ISLAND NADH (GAUZE/BANDAGES/DRESSINGS) ×2 IMPLANT
DRSG TELFA 4X8 ISLAND PHMB (GAUZE/BANDAGES/DRESSINGS) ×2 IMPLANT
ELECT CAUTERY BLADE TIP 2.5 (TIP) ×2
ELECT EZSTD 165MM 6.5IN (MISCELLANEOUS) ×2
ELECT REM PT RETURN 9FT ADLT (ELECTROSURGICAL) ×2
ELECTRODE CAUTERY BLDE TIP 2.5 (TIP) ×1 IMPLANT
ELECTRODE EZSTD 165MM 6.5IN (MISCELLANEOUS) ×1 IMPLANT
ELECTRODE REM PT RTRN 9FT ADLT (ELECTROSURGICAL) ×1 IMPLANT
GLOVE BIO SURGEON STRL SZ 6.5 (GLOVE) ×2 IMPLANT
GLOVE BIOGEL PI IND STRL 7.0 (GLOVE) ×1 IMPLANT
GLOVE BIOGEL PI INDICATOR 7.0 (GLOVE) ×1
GLOVE INDICATOR 7.0 STRL GRN (GLOVE) ×2 IMPLANT
GOWN STRL REUS W/ TWL LRG LVL3 (GOWN DISPOSABLE) ×2 IMPLANT
GOWN STRL REUS W/TWL LRG LVL3 (GOWN DISPOSABLE) ×2
KIT TURNOVER KIT A (KITS) ×2 IMPLANT
LABEL OR SOLS (LABEL) ×2 IMPLANT
LIGASURE IMPACT 36 18CM CVD LR (INSTRUMENTS) ×2 IMPLANT
NEEDLE HYPO 22GX1.5 SAFETY (NEEDLE) ×2 IMPLANT
NS IRRIG 1000ML POUR BTL (IV SOLUTION) ×2 IMPLANT
PACK BASIN MAJOR ARMC (MISCELLANEOUS) ×2 IMPLANT
RELOAD LINEAR CUT PROX 55 BLUE (ENDOMECHANICALS) ×6 IMPLANT
SPONGE LAP 18X18 RF (DISPOSABLE) ×2 IMPLANT
STAPLER PROXIMATE 55 BLUE (STAPLE) ×2 IMPLANT
STAPLER SKIN PROX 35W (STAPLE) ×2 IMPLANT
SUT PDS AB 1 TP1 96 (SUTURE) ×4 IMPLANT
SUT SILK 2 0 (SUTURE) ×1
SUT SILK 2-0 18XBRD TIE 12 (SUTURE) ×1 IMPLANT
SUT SILK 3 0 (SUTURE) ×1
SUT SILK 3-0 18XBRD TIE 12 (SUTURE) ×1 IMPLANT
SUT VIC AB 3-0 SH 27 (SUTURE)
SUT VIC AB 3-0 SH 27X BRD (SUTURE) IMPLANT
SYR 20ML LL LF (SYRINGE) IMPLANT
SYR BULB IRRIG 60ML STRL (SYRINGE) ×2 IMPLANT
TRAY FOLEY MTR SLVR 16FR STAT (SET/KITS/TRAYS/PACK) ×2 IMPLANT

## 2019-07-08 NOTE — ED Notes (Addendum)
Patient transported to CT 

## 2019-07-08 NOTE — Anesthesia Procedure Notes (Signed)
Procedure Name: Intubation Date/Time: 07/08/2019 7:52 PM Performed by: Sherrine Maples, CRNA Pre-anesthesia Checklist: Patient identified, Patient being monitored, Timeout performed, Emergency Drugs available and Suction available Patient Re-evaluated:Patient Re-evaluated prior to induction Oxygen Delivery Method: Circle system utilized Preoxygenation: Pre-oxygenation with 100% oxygen Induction Type: IV induction Ventilation: Mask ventilation without difficulty Laryngoscope Size: Miller and 2 Grade View: Grade I Tube type: Oral Tube size: 6.5 mm Number of attempts: 1 Airway Equipment and Method: Stylet Placement Confirmation: ETT inserted through vocal cords under direct vision,  positive ETCO2 and breath sounds checked- equal and bilateral Secured at: 21 cm Tube secured with: Tape Dental Injury: Teeth and Oropharynx as per pre-operative assessment

## 2019-07-08 NOTE — Anesthesia Post-op Follow-up Note (Signed)
Anesthesia QCDR form completed.        

## 2019-07-08 NOTE — Op Note (Addendum)
Operative Note  Preoperative Diagnosis: Small bowel obstruction  Postoperative Diagnosis: Same, secondary to adhesive band.  Necrotic bowel.  Operation: Exploratory laparotomy, small bowel resection, adhesiolysis, appendectomy  Surgeon: Fredirick Maudlin, MD  Assistant: None  Anesthesia: General endotracheal  Findings: There was approximately 1 L of hemorrhagic ascites in the abdomen.  There was a single adhesive band causing a closed loop obstruction and bowel ischemia.  Approximately 102 cm of dead intestine required resection.  There was an incidental finding of a Meckel's diverticulum.  There was additional scar tissue throughout the abdomen.  Indications: This is a 64 year old woman whose only past abdominal surgery was a tubal ligation.  She presented to the emergency department this morning with abdominal pain, nausea, and vomiting.  CT scan was concerning for bowel obstruction.  Her lactic acid was elevated.  In the course of a few hours, her white blood cell count jumped from 5000 to nearly 14,000.  There was significant concern for ischemic bowel, therefore she was taken emergently to the operating room for exploratory laparotomy.  Procedure In Detail: After informed consent was obtained, the patient was taken to the operating room and placed supine on the OR table.  Bony prominences were padded and bilateral sequential compression devices were placed on the lower extremities.  General endotracheal anesthesia was induced without incident.  A Foley catheter was aseptically placed.  A nasogastric tube had been placed in the emergency department and this was left in situ.  Perioperative antibiotics were administered.  The patient was positioned appropriately for the operation.  She was sterilely prepped and draped in standard fashion.  A time was performed confirming the patient's identity, the procedure being performed, her allergies, all necessary equipment was available, and that  maintenance anesthesia was adequate.  A vertical midline incision was made from just above the umbilicus to a few centimeters below the umbilicus.  This was carried down through the subcutaneous tissues with a combination of electrocautery and fat-splitting technique.  The fascia was opened.  The peritoneum was grasped and opened sharply.  Finger sweep along the underside of the incision revealed no adhesions.  The incision was opened fully.  There was an immediate gush of hemorrhagic ascites.  There was approximately 1 L that was suctioned free with a Poole sucker.  The abdomen was then explored.  The small bowel was identified and followed up to the ligament of Treitz.  The bowel was then carefully run and as we near the right lower quadrant, grossly necrotic bowel was encountered.  Upon further exploration, a single adhesive band was binding the intestine in a closed loop.  This was divided.  Incidentally, there was a Meckel's diverticulum just distal to the ischemic bowel.  It did not appear to be in any way contributory to the internal hernia and closed-loop obstruction.  A section of healthy-appearing bowel was identified on either side of the necrotic intestine.  The bowel was divided with the 55 blue load GIA stapler on each side of the dead bowel.  The LigaSure was used to divide the mesentery.  The Meckel's diverticulum was included in the resected bowel section.  The resected bowel was measured and was approximately 102 cm of necrotic gut.  It was handed off as a specimen.  The antimesenteric portions of the healthy appearing bowel were then brought together.  The corners of the stapled ends were cut off and a side to side, functional end-to-end anastomosis was created with an additional firing of the GIA  stapler.  The anastomosis was completed with a fourth firing of the GIA.  The mesenteric defect was then closed with running 3-0 Vicryl.  The stapled ends of the anastomosis were imbricated with 3-0  silk Lembert sutures.    The abdomen was then explored again.  Additional adhesions in the area of the fallopian tubes were lysed.  The entire bowel was run from the ligament of Treitz all the way to the terminal ileum.  The terminal ileum, appendix, and cecum were slightly twisted due to scar tissue.  This scar was lysed.  I decided to perform an appendectomy out of concern for potential scar tissue in the area complicating a future operation, should it be indicated.  The mesoappendix was divided with the LigaSure and the appendix divided at the cecum with the GIA stapler.  It was handed off as a specimen.  The bowel was run yet again.  At least 200 cm of healthy-appearing small bowel remain in situ.  The abdomen was then thoroughly irrigated with saline until it ran clear.  Liposomal bupivacaine was infiltrated along the peritoneum and fascial planes.  The fascia was closed with #1 PDS suture.  The subcutaneum was irrigated and the skin was closed with staples.  The patient was awakened, extubated, and taken to the postanesthesia care unit in stable condition.  EBL: Less than 20 cc  IVF: 3 L of crystalloid  UOP: 450 cc  Specimen(s): Necrotic small bowel with incidental Meckel's diverticulum, appendix.  Complications: none immediately apparent.   Counts: all needles, instruments, and sponges were counted and reported to be correct in number at the end of the case.   I was present for and participated in the entire operation.  Fredirick Maudlin 9:37 PM

## 2019-07-08 NOTE — ED Notes (Signed)
Report given to Laura, RN.

## 2019-07-08 NOTE — ED Triage Notes (Addendum)
Pt c/o sudden onset mid abd pain that radiates into the back with N/V that started around 630am. Denies any similar sx in the past.

## 2019-07-08 NOTE — ED Notes (Signed)
Called xray for DG abdomen

## 2019-07-08 NOTE — Transfer of Care (Signed)
Immediate Anesthesia Transfer of Care Note  Patient: Jordan Mann  Procedure(s) Performed: EXPLORATORY LAPAROTOMY possible bowel resection (N/A )  Patient Location: PACU  Anesthesia Type:General  Level of Consciousness: drowsy and patient cooperative  Airway & Oxygen Therapy: Patient Spontanous Breathing and Patient connected to face mask oxygen  Post-op Assessment: Report given to RN and Post -op Vital signs reviewed and stable  Post vital signs: Reviewed and stable  Last Vitals:  Vitals Value Taken Time  BP 117/54 07/08/19 2145  Temp    Pulse 61 07/08/19 2148  Resp 12 07/08/19 2148  SpO2 100 % 07/08/19 2148  Vitals shown include unvalidated device data.  Last Pain:  Vitals:   07/08/19 1659  TempSrc:   PainSc: 6          Complications: No apparent anesthesia complications

## 2019-07-08 NOTE — ED Notes (Signed)
X-ray at bedside

## 2019-07-08 NOTE — ED Notes (Signed)
Dr Williams at bedside 

## 2019-07-08 NOTE — Anesthesia Preprocedure Evaluation (Addendum)
Anesthesia Evaluation  Patient identified by MRN, date of birth, ID band Patient awake    Reviewed: Allergy & Precautions, H&P , NPO status , Patient's Chart, lab work & pertinent test results  Airway Mallampati: II  TM Distance: >3 FB Neck ROM: full    Dental  (+) Chipped   Pulmonary neg pulmonary ROS, neg COPD,           Cardiovascular (-) angina(-) Past MI + dysrhythmias (h/o sinus bradycardia.  Also sinus brady today)      Neuro/Psych PSYCHIATRIC DISORDERS Depression negative neurological ROS     GI/Hepatic Neg liver ROS, SBO   Endo/Other  Hyperthyroidism   Renal/GU      Musculoskeletal   Abdominal   Peds  Hematology negative hematology ROS (+)   Anesthesia Other Findings Past Medical History: No date: Arthritis No date: Basal cell carcinoma No date: Fecal incontinence No date: Glaucoma No date: History of mammogram No date: Hypercholesteremia 1990: Hyperthyroidism     Comment:  Graves disease -treated with PTU-now resolved. No date: Mild depression ([place]) No date: Overactive bladder  Past Surgical History: 10/2016: BASAL CELL CARCINOMA EXCISION     Comment:  Moh's surgery 2010: COLONOSCOPY 2015: CYSTOSCOPY 2011: ESOPHAGOGASTRODUODENOSCOPY No date: myringtomy No date: TUBAL LIGATION  BMI    Body Mass Index: 23.44 kg/m      Reproductive/Obstetrics negative OB ROS                           Anesthesia Physical Anesthesia Plan  ASA: III  Anesthesia Plan: General ETT   Post-op Pain Management:    Induction:   PONV Risk Score and Plan: Ondansetron, Dexamethasone and Treatment may vary due to age or medical condition  Airway Management Planned:   Additional Equipment:   Intra-op Plan:   Post-operative Plan:   Informed Consent: I have reviewed the patients History and Physical, chart, labs and discussed the procedure including the risks, benefits and alternatives  for the proposed anesthesia with the patient or authorized representative who has indicated his/her understanding and acceptance.     Dental Advisory Given  Plan Discussed with: Anesthesiologist and CRNA  Anesthesia Plan Comments:       Anesthesia Quick Evaluation

## 2019-07-08 NOTE — ED Provider Notes (Signed)
[facility] Emergency Department Provider Note       Time seen: ----------------------------------------- 9:34 AM on 07/08/2019 -----------------------------------------   I have reviewed the triage vital signs and the nursing notes.  HISTORY   Chief Complaint Abdominal Pain    HPI Jordan Mann is a 63 y.o. female with a history of arthritis, hyperlipidemia, hypothyroidism, depression who presents to the ED for sudden onset of mid abdominal pain that radiates into her back with nausea vomiting that started around 6:30 AM.  She denies any history of this before.  Denies fevers or chills, has had 8 episodes of vomiting.  Past Medical History:  Diagnosis Date  . Arthritis   . Basal cell carcinoma   . Fecal incontinence   . Glaucoma   . History of mammogram   . Hypercholesteremia   . Hyperthyroidism 1990   Graves disease -treated with PTU-now resolved.  . Mild depression ([place])   . Overactive bladder     Patient Active Problem List   Diagnosis Date Noted  . Overactive bladder 02/18/2018  . Glaucoma   . Basal cell carcinoma   . Insomnia, unspecified 02/09/2017  . Stool incontinence 02/27/2016  . Hx of sinus bradycardia 07/16/2015  . Nocturia 06/06/2015  . Urinary incontinence 02/04/2014  . 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 Arthropathy of spinal facet joint 01/31/2014  . History of Graves' disease 08/23/1988    Past Surgical History:  Procedure Laterality Date  . BASAL CELL CARCINOMA EXCISION  10/2016   Moh's surgery  . COLONOSCOPY  2010  . CYSTOSCOPY  2015  . ESOPHAGOGASTRODUODENOSCOPY  2011  . myringtomy    . TUBAL LIGATION      Allergies Prednisone  Social History Social History   Tobacco Use  . Smoking status: Never Smoker  . Smokeless tobacco: Never Used  Substance Use Topics  . Alcohol use: No  . Drug use: No   Review of Systems Constitutional: Negative for fever. Cardiovascular:  Negative for chest pain. Respiratory: Negative for shortness of breath. Gastrointestinal: Positive for abdominal pain, vomiting Musculoskeletal: Negative for back pain. Skin: Negative for rash. Neurological: Negative for headaches, focal weakness or numbness.  All systems negative/normal/unremarkable except as stated in the HPI  ____________________________________________   PHYSICAL EXAM:  VITAL SIGNS: ED Triage Vitals  Enc Vitals Group     BP 07/08/19 0845 (!) 139/99     Pulse Rate 07/08/19 0845 (!) 58     Resp 07/08/19 0845 18     Temp 07/08/19 0845 97.7 F (36.5 C)     Temp Source 07/08/19 0845 Oral     SpO2 07/08/19 0845 100 %     Weight 07/08/19 0840 120 lb (54.4 kg)     Height 07/08/19 0840 5' (1.524 m)     Head Circumference --      Peak Flow --      Pain Score 07/08/19 0840 10     Pain Loc --      Pain Edu? --      Excl. in [place]? --    Constitutional: Alert and oriented.  Mild distress Eyes: Conjunctivae are normal. Normal extraocular movements. ENT      Head: Normocephalic and atraumatic.      Nose: No congestion/rhinnorhea.      Mouth/Throat: Mucous membranes are moist.      Neck: No stridor. Cardiovascular: Normal rate, regular rhythm. No murmurs, rubs, or gallops. Respiratory: Normal respiratory effort without tachypnea nor retractions.  Breath sounds are clear and equal bilaterally. No wheezes/rales/rhonchi. Gastrointestinal: Diffuse tenderness with some distention and with questionable guarding, normal bowel sounds Musculoskeletal: Nontender with normal range of motion in extremities. No lower extremity tenderness nor edema. Neurologic:  Normal speech and language. No gross focal neurologic deficits are appreciated.  Skin:  Skin is warm, dry and intact. No rash noted. Psychiatric: Mood and affect are normal. Speech and behavior are normal.  ____________________________________________  EKG: Interpreted by me.  Sinus bradycardia with rate of 55 bpm, normal  PR interval, normal QRS, normal QT  ____________________________________________  ED COURSE:  As part of my medical decision making, I reviewed the following data within the [place] History obtained from family if available, nursing notes, old chart and ekg, as well as notes from prior ED visits. Patient presented for abdominal pain with vomiting, we will assess with labs and imaging as indicated at this time.   Procedures  ANALICE HOLTZ was evaluated in Emergency Department on 07/08/2019 for the symptoms described in the history of present illness. She was evaluated in the context of the global COVID-19 pandemic, which necessitated consideration that the patient might be at risk for infection with the SARS-CoV-2 virus that causes COVID-19. Institutional protocols and algorithms that pertain to the evaluation of patients at risk for COVID-19 are in a state of rapid change based on information released by regulatory bodies including the CDC and federal and state organizations. These policies and algorithms were followed during the patient's care in the ED.  ____________________________________________   LABS (pertinent positives/negatives)  Labs Reviewed  COMPREHENSIVE METABOLIC PANEL - Abnormal; Notable for the following components:      Result Value   Glucose, Bld 139 (*)    All other components within normal limits  SARS CORONAVIRUS 2 BY RT PCR (HOSPITAL ORDER, [place] [facility])  LIPASE, BLOOD  CBC  URINALYSIS, COMPLETE (UACMP) WITH MICROSCOPIC  LACTIC ACID, PLASMA    RADIOLOGY Images were viewed by me  CT of the abdomen pelvis with contrast  IMPRESSION:  1. Closed loop small bowel obstruction converging to a point in the  mesentery in the right mid to lower abdomen with associated  extensive bowel wall and mesenteric edema. This is compatible with a  closed loop obstruction associated with an internal hernia or  adhesion with extensive  small bowel and mesenteric ischemia.  2. Small amount of free peritoneal fluid.  3. Mild gallbladder wall thickening and minimal calcification in the  distal fundus. This could be due to adenomyomatosis. The  calcification could also be early changes of porcelain gallbladder,  which is associated with gallbladder carcinoma.  4. 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   Critical Value/emergent results were called by telephone at the time  of interpretation on 07/08/2019 at 10:43 am to [facility] , who verbally acknowledged these results.  ____________________________________________   CRITICAL CARE Performed by: Laurence Aly   Total critical care time: 30 minutes  Critical care time was exclusive of separately billable procedures and treating other patients.  Critical care was necessary to treat or prevent imminent or life-threatening deterioration.  Critical care was time spent personally by me on the following activities: development of treatment plan with patient and/or surrogate as well as nursing, discussions with consultants, evaluation of patient's response to treatment, examination of patient, obtaining history from patient or surrogate, ordering and performing treatments and interventions, ordering and review of laboratory studies, ordering and review  of radiographic studies, pulse oximetry and re-evaluation of patient's condition.   DIFFERENTIAL DIAGNOSIS   Gastritis, gastroenteritis, dehydration, electrolyte abnormality, bowel obstruction, biliary colic, cholecystitis, appendicitis, renal colic  FINAL ASSESSMENT AND PLAN  Small bowel obstruction   Plan: The patient had presented for abdominal pain and vomiting. Patient's labs initially were unremarkable. Patient's imaging did reveal a small bowel obstruction associate with internal hernia or adhesions.  She states she has never had abdominal surgery before.  I discussed with general  surgery, we will place an NG tube, provide pain medicine and a rapid Covid test.   Laurence Aly, MD    Note: This note was generated in part or whole with voice recognition software. Voice recognition is usually quite accurate but there are transcription errors that can and very often do occur. I apologize for any typographical errors that were not detected and corrected.     Earleen Newport, MD 07/08/19 1052

## 2019-07-08 NOTE — H&P (Signed)
Jordan Mann is an 63 y.o. female.   Chief Complaint: Abdominal pain, nausea, vomiting. HPI: Jordan Mann is a 63 year old woman who presented to the emergency department with nausea, vomiting, and abdominal pain that started around 630 this morning.  She has had prior episodes of abdominal pain that were similar.  She says that she took Imodium in the past and they resolved on their own.  This episode is significantly worse than those prior.  Her emesis is nonbloody and nonbilious.  The pain is focally worse in the right lower quadrant but is diffuse.  It radiates to her back.  She denies any fevers or chills.  No diarrhea or constipation.  Pain does not seem to be alleviated or exacerbated by anything.  She denies any prior abdominal surgeries.  Past Medical History:  Diagnosis Date  . Arthritis   . Basal cell carcinoma   . Fecal incontinence   . Glaucoma   . History of mammogram   . Hypercholesteremia   . Hyperthyroidism 1990   Graves disease -treated with PTU-now resolved.  . Mild depression ([place])   . Overactive bladder     Past Surgical History:  Procedure Laterality Date  . BASAL CELL CARCINOMA EXCISION  10/2016   Moh's surgery  . COLONOSCOPY  2010  . CYSTOSCOPY  2015  . ESOPHAGOGASTRODUODENOSCOPY  2011  . myringtomy    . TUBAL LIGATION      Family History  Problem Relation Age of Onset  . Diabetes type II Mother   . Hypertension Mother   . Cervical cancer Sister 25  . Paranoid behavior Son   . Breast cancer Neg Hx   . Colon cancer Neg Hx   . Ovarian cancer Neg Hx    Social History:  reports that she has never smoked. She has never used smokeless tobacco. She reports that she does not drink alcohol or use drugs.  Allergies:  Allergies  Allergen Reactions  . Prednisone     Make her feel nauseous and "funny in the head"    Current Meds  Medication Sig  . mirabegron ER (MYRBETRIQ) 50 MG TB24 tablet Take 50 mg by mouth.  . mometasone (NASONEX) 50 MCG/ACT nasal  spray SHAKE LIQUID WELL AND USE 2 SPRAYS IN EACH NOSTRIL EVERY DAY  . ROCKLATAN 0.02-0.005 % SOLN INT 1 GTT IN OU QHS  . rosuvastatin (CRESTOR) 5 MG tablet TK 1 T PO QD  . traZODone (DESYREL) 50 MG tablet Take 50 mg by mouth.    Results for orders placed or performed during the hospital encounter of 07/08/19 (from the past 48 hour(s))  Lipase, blood     Status: None   Collection Time: 07/08/19  8:50 AM  Result Value Ref Range   Lipase 25 11 - 51 U/L    Comment: Performed at [facility], [address] [place], [place] [address]  Comprehensive metabolic panel     Status: Abnormal   Collection Time: 07/08/19  8:50 AM  Result Value Ref Range   Sodium 139 135 - 145 mmol/L   Potassium 4.0 3.5 - 5.1 mmol/L   Chloride 103 98 - 111 mmol/L   CO2 27 22 - 32 mmol/L   Glucose, Bld 139 (H) 70 - 99 mg/dL   BUN 16 8 - 23 mg/dL   Creatinine, Ser 0.90 0.44 - 1.00 mg/dL   Calcium 9.1 8.9 - 10.3 mg/dL   Total Protein 6.6 6.5 - 8.1 g/dL   Albumin 4.0 3.5 -  5.0 g/dL   AST 28 15 - 41 U/L   ALT 16 0 - 44 U/L   Alkaline Phosphatase 45 38 - 126 U/L   Total Bilirubin 0.7 0.3 - 1.2 mg/dL   GFR calc non Af Amer >60 >60 mL/min   GFR calc Af Amer >60 >60 mL/min   Anion gap 9 5 - 15    Comment: Performed at [facility], [address] [place], [place] [address]  CBC     Status: None   Collection Time: 07/08/19  8:50 AM  Result Value Ref Range   WBC 5.5 4.0 - 10.5 K/uL   RBC 4.24 3.87 - 5.11 MIL/uL   Hemoglobin 13.0 12.0 - 15.0 g/dL   HCT 38.1 36.0 - 46.0 %   MCV 89.9 80.0 - 100.0 fL   MCH 30.7 26.0 - 34.0 pg   MCHC 34.1 30.0 - 36.0 g/dL   RDW 11.7 11.5 - 15.5 %   Platelets 181 150 - 400 K/uL   nRBC 0.0 0.0 - 0.2 %    Comment: Performed at [facility], [address] [place],  [address]   Ct Abdomen Pelvis W Contrast  Result Date: 07/08/2019 CLINICAL DATA:  Acute, diffuse abdominal pain radiating to the back with associated nausea and vomiting since 6:30  a.m. today. EXAM: CT ABDOMEN AND PELVIS WITH CONTRAST TECHNIQUE: Multidetector CT imaging of the abdomen and pelvis was performed using the standard protocol following bolus administration of intravenous contrast. CONTRAST:  70mL OMNIPAQUE IOHEXOL 300 MG/ML  SOLN COMPARISON:  None. FINDINGS: Lower chest: Unremarkable. Hepatobiliary: 2 tiny probable liver cysts. Mild gallbladder wall thickening and minimal calcification in the distal fundus. No visible gallstones. Pancreas: Unremarkable. No pancreatic ductal dilatation or surrounding inflammatory changes. Spleen: Small cyst. Otherwise, normal. Adrenals/Urinary Tract: Adrenal glands are unremarkable. Kidneys are normal, without renal calculi, focal lesion, or hydronephrosis. Bladder is unremarkable. Stomach/Bowel: There is an oval collection of dilated small bowel loops diffuse low density wall thickening and extensive mesenteric edema in the right mid to lower abdomen. These converge to a point in the mesentery in the right mid abdomen. No pneumatosis seen. The remainder of the small bowel, colon and appendix have normal appearances. There is a patulous esophageal ampulla or small hiatal hernia. The remainder of the stomach has a normal appearance. Vascular/Lymphatic: Atheromatous arterial calcifications without aneurysm. No enlarged lymph nodes. Reproductive: Uterus and bilateral adnexa are unremarkable. Other: Small amount of free peritoneal fluid. No free peritoneal air. Musculoskeletal: Lower thoracic and minimal lumbar spine degenerative changes. IMPRESSION: 1. Closed loop small bowel obstruction converging to a point in the mesentery in the right mid to lower abdomen with associated extensive bowel wall and mesenteric edema. This is compatible with a closed loop obstruction associated with an internal hernia or adhesion with extensive small bowel and mesenteric ischemia. 2. Small amount of free peritoneal fluid. 3. Mild gallbladder wall thickening and minimal  calcification in the distal fundus. This could be due to adenomyomatosis. The calcification could also be early changes of porcelain gallbladder, which is associated with gallbladder carcinoma. 4. 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 Critical Value/emergent results were called by telephone at the time of interpretation on 07/08/2019 at 10:43 am to [facility] , who verbally acknowledged these results. Electronically Signed   By: Claudie Revering M.D.   On: 07/08/2019 10:48    Review of Systems  Gastrointestinal: Positive for abdominal pain, nausea and vomiting.  Genitourinary: Positive for frequency and urgency.  Musculoskeletal: Positive for back pain.  All other systems reviewed and are negative.   Blood pressure (!) 139/99, pulse (!) 58, temperature 97.7 F (36.5 C), temperature source Oral, resp. rate 18, height 5' (1.524 m), weight 54.4 kg, SpO2 100 %. Physical Exam  Constitutional: She is oriented to person, place, and time. She appears well-developed and well-nourished.  Appears acutely uncomfortable  HENT:  Head: Normocephalic and atraumatic.  Mouth/Throat: Oropharynx is clear and moist.  Eyes: Right eye exhibits no discharge. Left eye exhibits no discharge. No scleral icterus.  Neck: No tracheal deviation present.  Cardiovascular: Regular rhythm.  Respiratory: Effort normal. No stridor. No respiratory distress.  GI: Soft. She exhibits no distension. There is abdominal tenderness. There is guarding. There is no rebound.  There is voluntary guarding in the right lower quadrant.  Genitourinary:    Genitourinary Comments: Deferred   Musculoskeletal:        General: No deformity or edema.  Neurological: She is alert and oriented to person, place, and time.  Skin: Skin is warm and dry.  Psychiatric: She has a normal mood and affect.     Assessment/Plan This is a 63 year old woman who presented to the emergency department this morning with abdominal pain,  nausea, and vomiting.  CT scan is concerning for bowel obstruction.  The patient denies any prior history of abdominal surgery, however the electronic medical record indicates that she has had a tubal ligation.  She has had prior episodes of similar pain in the past, however these have resolved without intervention.  We will plan to admit her for IV fluids, pain control, antiemetics, and nasoenteric decompression.  I did discuss with her and her husband that there is a possibility that she may fail conservative management and require operative intervention.  Based upon her CT scan findings, I think this is more likely than not.  The risks of surgery were discussed with both of them.  These include, but are not limited to, bleeding, infection, need for bowel resection, damage to surrounding tissues or structures, risks of general anesthesia.  Fredirick Maudlin, MD 07/08/2019, 11:36 AM

## 2019-07-09 ENCOUNTER — Encounter: Payer: Self-pay | Admitting: General Surgery

## 2019-07-09 LAB — CBC
HCT: 33 % — ABNORMAL LOW (ref 36.0–46.0)
Hemoglobin: 11.8 g/dL — ABNORMAL LOW (ref 12.0–15.0)
MCH: 31.5 pg (ref 26.0–34.0)
MCHC: 35.8 g/dL (ref 30.0–36.0)
MCV: 88 fL (ref 80.0–100.0)
Platelets: 157 10*3/uL (ref 150–400)
RBC: 3.75 MIL/uL — ABNORMAL LOW (ref 3.87–5.11)
RDW: 11.6 % (ref 11.5–15.5)
WBC: 12.4 10*3/uL — ABNORMAL HIGH (ref 4.0–10.5)
nRBC: 0 % (ref 0.0–0.2)

## 2019-07-09 LAB — MAGNESIUM: Magnesium: 1.3 mg/dL — ABNORMAL LOW (ref 1.7–2.4)

## 2019-07-09 LAB — COMPREHENSIVE METABOLIC PANEL
ALT: 13 U/L (ref 0–44)
AST: 25 U/L (ref 15–41)
Albumin: 2.6 g/dL — ABNORMAL LOW (ref 3.5–5.0)
Alkaline Phosphatase: 31 U/L — ABNORMAL LOW (ref 38–126)
Anion gap: 6 (ref 5–15)
BUN: 18 mg/dL (ref 8–23)
CO2: 26 mmol/L (ref 22–32)
Calcium: 8.1 mg/dL — ABNORMAL LOW (ref 8.9–10.3)
Chloride: 106 mmol/L (ref 98–111)
Creatinine, Ser: 0.81 mg/dL (ref 0.44–1.00)
GFR calc Af Amer: >60 mL/min (ref >60)
GFR calc non Af Amer: >60 mL/min (ref >60)
Glucose, Bld: 242 mg/dL — ABNORMAL HIGH (ref 70–99)
Potassium: 4.1 mmol/L (ref 3.5–5.1)
Sodium: 138 mmol/L (ref 135–145)
Total Bilirubin: 0.4 mg/dL (ref 0.3–1.2)
Total Protein: 4.7 g/dL — ABNORMAL LOW (ref 6.5–8.1)

## 2019-07-09 LAB — PHOSPHORUS: Phosphorus: 2.1 mg/dL — ABNORMAL LOW (ref 2.5–4.6)

## 2019-07-09 LAB — LACTIC ACID, PLASMA: Lactic Acid, Venous: 2.1 mmol/L (ref 0.5–1.9)

## 2019-07-09 MED ORDER — SODIUM CHLORIDE 0.9 % IV SOLN
INTRAVENOUS | Status: DC | PRN
  Administered 2019-07-09: 250 mL via INTRAVENOUS
  Administered 2019-07-12: 1 mL via INTRAVENOUS
  Administered 2019-07-13: 20 mL via INTRAVENOUS

## 2019-07-09 MED ORDER — MAGNESIUM SULFATE 2 GM/50ML IV SOLN
2.0000 g | Freq: Once | INTRAVENOUS | Status: AC
  Administered 2019-07-09: 2 g via INTRAVENOUS
  Filled 2019-07-09: qty 50

## 2019-07-09 MED ORDER — LACTATED RINGERS IV SOLN
INTRAVENOUS | Status: DC
  Administered 2019-07-09 – 2019-07-11 (×5): via INTRAVENOUS

## 2019-07-09 MED ORDER — SODIUM PHOSPHATES 45 MMOLE/15ML IV SOLN
10.0000 mmol | Freq: Once | INTRAVENOUS | Status: AC
  Administered 2019-07-09: 10 mmol via INTRAVENOUS
  Filled 2019-07-09: qty 3.33

## 2019-07-09 MED ORDER — PHENOL 1.4 % MT LIQD
1.0000 | OROMUCOSAL | Status: DC | PRN
  Filled 2019-07-09: qty 177

## 2019-07-09 NOTE — Progress Notes (Addendum)
[place] Hospital Day(s): 1.   Post op day(s): 1 Day Post-Op.   Interval History:  Patient seen and examined no acute events or new complaints overnight.  Patient reports she is doping well, incisional soreness but pain improved compared to yesterday.  No nausea or emesis, no flatus  Lactic acidosis improving, down to 2.1 Leukocytosis improved to 12K Electrolyte derangement; low phos and mag Good U/O Has not mobilized yet but plans to today  Vital signs in last 24 hours: [min-max] current  Temp:  [97.2 F (36.2 C)-99.5 F (37.5 C)] 99.5 F (37.5 C) (11/16 0540) Pulse Rate:  [58-94] 94 (11/16 0540) Resp:  [12-20] 20 (11/16 0540) BP: (116-148)/(49-99) 134/53 (11/16 0540) SpO2:  [95 %-100 %] 96 % (11/16 0540) Weight:  [54.4 kg] 54.4 kg (11/15 0840)     Height: 5' (152.4 cm) Weight: 54.4 kg BMI (Calculated): 23.44   Intake/Output last 2 shifts:  11/15 0701 - 11/16 0700 In: 5296.8 [I.V.:4257.4; IV Piggyback:1039.5] Out: 600 [Urine:500; Blood:100]   Physical Exam:  Constitutional: alert, cooperative and no distress  HEENT: NGT in place Respiratory: breathing non-labored at rest  Cardiovascular: regular rate and sinus rhythm  Gastrointestinal: soft, incisional soreness, and non-distended. No rebound/guarding Integumentary: Laparotomy incision with midline dressing, no drainage  Labs:  CBC Latest Ref Rng & Units 07/09/2019 07/08/2019 07/08/2019  WBC 4.0 - 10.5 K/uL 12.4(H) 13.9(H) 5.5  Hemoglobin 12.0 - 15.0 g/dL 11.8(L) 14.1 13.0  Hematocrit 36.0 - 46.0 % 33.0(L) 41.9 38.1  Platelets 150 - 400 K/uL 157 219 181   CMP Latest Ref Rng & Units 07/09/2019 07/08/2019 07/08/2019  Glucose 70 - 99 mg/dL 242(H) - 139(H)  BUN 8 - 23 mg/dL 18 - 16  Creatinine 0.44 - 1.00 mg/dL 0.81 0.76 0.90  Sodium 135 - 145 mmol/L 138 - 139  Potassium 3.5 - 5.1 mmol/L 4.1 - 4.0  Chloride 98 - 111 mmol/L 106 - 103  CO2 22 - 32 mmol/L 26 - 27  Calcium  8.9 - 10.3 mg/dL 8.1(L) - 9.1  Total Protein 6.5 - 8.1 g/dL 4.7(L) - 6.6  Total Bilirubin 0.3 - 1.2 mg/dL 0.4 - 0.7  Alkaline Phos 38 - 126 U/L 31(L) - 45  AST 15 - 41 U/L 25 - 28  ALT 0 - 44 U/L 13 - 16    Imaging studies: No new pertinent imaging studies   Assessment/Plan:  63 y.o. female with electrolyte derangement but otherwise improving leukocytosis and lactic acidosis still awaiting bowel function return 1 Day Post-Op s/p exploratory laparotomy, adhesiolysis, and small bowel resection for SBO with necrotic bowel.   - Continue NPO + IVF (changed to LR w/o D5 given hyperglycemia this morning)  - Continue NGT decompression: monitor output  - Replete electrolytes; re-check in the AM  - pain control prn (minimize narcotics if possible), antiemetics prn  - monitor abdominal examination; on-going bowel function.  Anticipate prolonged ileus.  May need to consider parenteral nutrition.   - discontinue foley; monitor fluid status  - mobilization encouraged  - medical management of comorbidities   - DVT prophylaxis   All of the above findings and recommendations were discussed with the patient, and the medical team, and all of patient's questions were answered to her expressed satisfaction.  -- Edison Simon, PA-C [facility] 07/09/2019, 7:34 AM [phone] M-F: 7am - 4pm I saw and evaluated the patient.  I agree with the above documentation, exam, and plan, which I have edited  where appropriate. Fredirick Maudlin  10:30 AM

## 2019-07-09 NOTE — Progress Notes (Signed)
Order to remove foley catheter placed per MD order. Orders followed. Foley catheter removed.

## 2019-07-10 LAB — BASIC METABOLIC PANEL
Anion gap: 7 (ref 5–15)
BUN: 20 mg/dL (ref 8–23)
CO2: 30 mmol/L (ref 22–32)
Calcium: 8.2 mg/dL — ABNORMAL LOW (ref 8.9–10.3)
Chloride: 105 mmol/L (ref 98–111)
Creatinine, Ser: 1.12 mg/dL — ABNORMAL HIGH (ref 0.44–1.00)
GFR calc Af Amer: >60 mL/min (ref >60)
GFR calc non Af Amer: 52 mL/min — ABNORMAL LOW (ref >60)
Glucose, Bld: 99 mg/dL (ref 70–99)
Potassium: 3.8 mmol/L (ref 3.5–5.1)
Sodium: 142 mmol/L (ref 135–145)

## 2019-07-10 LAB — CBC
HCT: 25.5 % — ABNORMAL LOW (ref 36.0–46.0)
Hemoglobin: 8.7 g/dL — ABNORMAL LOW (ref 12.0–15.0)
MCH: 31.3 pg (ref 26.0–34.0)
MCHC: 34.1 g/dL (ref 30.0–36.0)
MCV: 91.7 fL (ref 80.0–100.0)
Platelets: 120 10*3/uL — ABNORMAL LOW (ref 150–400)
RBC: 2.78 MIL/uL — ABNORMAL LOW (ref 3.87–5.11)
RDW: 12 % (ref 11.5–15.5)
WBC: 12.6 10*3/uL — ABNORMAL HIGH (ref 4.0–10.5)
nRBC: 0 % (ref 0.0–0.2)

## 2019-07-10 LAB — HIV ANTIBODY (ROUTINE TESTING W REFLEX): HIV Screen 4th Generation wRfx: NONREACTIVE — AB

## 2019-07-10 LAB — PHOSPHORUS: Phosphorus: 3.5 mg/dL (ref 2.5–4.6)

## 2019-07-10 LAB — SURGICAL PATHOLOGY

## 2019-07-10 LAB — MAGNESIUM: Magnesium: 1.9 mg/dL (ref 1.7–2.4)

## 2019-07-10 MED ORDER — TRAZODONE HCL 50 MG PO TABS
50.0000 mg | ORAL_TABLET | Freq: Every day | ORAL | Status: DC
  Administered 2019-07-10 – 2019-07-13 (×4): 50 mg via ORAL
  Filled 2019-07-10 (×4): qty 1

## 2019-07-10 NOTE — Progress Notes (Addendum)
[place] Hospital Day(s): 2.   Post op day(s): 2 Days Post-Op.   Interval History: Patient seen and examined no acute events or new complaints overnight Patient reports se is doing well. She has abdominal soreness near the incision but this is manageable. No fever, chills, nausea or emesis.  No NGT recorded although output still appears like succus She is passing some flatus this morning Hgb - 8.7 - suspected dilutional Leukocytosis stable at 12.6  Electrolytes normalized after repletion Mobilizing without issue   Vital signs in last 24 hours: [min-max] current  Temp:  [97.5 F (36.4 C)-99.2 F (37.3 C)] 99.2 F (37.3 C) (11/17 0429) Pulse Rate:  [74-88] 74 (11/17 0429) Resp:  [18-20] 18 (11/17 0429) BP: (124-137)/(52-57) 135/57 (11/17 0429) SpO2:  [95 %-100 %] 95 % (11/17 0429)     Height: 5' (152.4 cm) Weight: 54.4 kg BMI (Calculated): 23.44   Intake/Output last 2 shifts:  11/16 0701 - 11/17 0700 In: 1430.3 [P.O.:120; I.V.:954.3; IV Piggyback:356] Out: 1250 [Urine:1250]   Physical Exam:  Constitutional: alert, cooperative and no distress  HEENT: NGT in place Respiratory: breathing non-labored at rest  Cardiovascular: regular rate and sinus rhythm  Gastrointestinal: soft, incisional soreness, and non-distended. No rebound/guarding Integumentary: Laparotomy incision is CDI with staples, no erythema, no drainage   Labs:  CBC Latest Ref Rng & Units 07/10/2019 07/09/2019 07/08/2019  WBC 4.0 - 10.5 K/uL 12.6(H) 12.4(H) 13.9(H)  Hemoglobin 12.0 - 15.0 g/dL 8.7(L) 11.8(L) 14.1  Hematocrit 36.0 - 46.0 % 25.5(L) 33.0(L) 41.9  Platelets 150 - 400 K/uL 120(L) 157 219   CMP Latest Ref Rng & Units 07/10/2019 07/09/2019 07/08/2019  Glucose 70 - 99 mg/dL 99 242(H) -  BUN 8 - 23 mg/dL 20 18 -  Creatinine 0.44 - 1.00 mg/dL 1.12(H) 0.81 0.76  Sodium 135 - 145 mmol/L 142 138 -  Potassium 3.5 - 5.1 mmol/L 3.8 4.1 -  Chloride 98 - 111  mmol/L 105 106 -  CO2 22 - 32 mmol/L 30 26 -  Calcium 8.9 - 10.3 mg/dL 8.2(L) 8.1(L) -  Total Protein 6.5 - 8.1 g/dL - 4.7(L) -  Total Bilirubin 0.3 - 1.2 mg/dL - 0.4 -  Alkaline Phos 38 - 126 U/L - 31(L) -  AST 15 - 41 U/L - 25 -  ALT 0 - 44 U/L - 13 -     Imaging studies: No new pertinent imaging studies   Assessment/Plan:  63 y.o. female overall doing well still awaiting decreased NGT out and significant bowel function return 2 Days Post-Op s/p exploratory laparotomy, adhesiolysis, and small bowel resection for SBO with necrotic bowel.   - Continue NPO + IVF (changed to LR w/o D5 given hyperglycemia this morning)             - Continue NGT decompression: monitor output  - Monitor Hgb - suspect dilutional, no evidence of bleeding             - pain control prn (minimize narcotics if possible), antiemetics prn             - monitor abdominal examination; on-going bowel function              - discontinue foley; monitor fluid status             - mobilization encouraged             - medical management of comorbidities              -  DVT prophylaxis   All of the above findings and recommendations were discussed with the patient, and the medical team, and all of patient's questions were answered to her expressed satisfaction.  -- Edison Simon, PA-C [facility] 07/10/2019, 7:33 AM [phone] M-F: 7am - 4pm

## 2019-07-10 NOTE — Anesthesia Postprocedure Evaluation (Signed)
Anesthesia Post Note  Patient: Jordan Mann  Procedure(s) Performed: EXPLORATORY LAPAROTOMY possible bowel resection (N/A )  Patient location during evaluation: PACU Anesthesia Type: General Level of consciousness: awake and alert Pain management: pain level controlled Vital Signs Assessment: post-procedure vital signs reviewed and stable Respiratory status: spontaneous breathing, nonlabored ventilation and respiratory function stable Cardiovascular status: blood pressure returned to baseline and stable Postop Assessment: no apparent nausea or vomiting Anesthetic complications: no     Last Vitals:  Vitals:   07/09/19 2032 07/10/19 0429  BP: (!) 137/52 (!) 135/57  Pulse: 80 74  Resp: 20 18  Temp: 37.1 C 37.3 C  SpO2: 100% 95%    Last Pain:  Vitals:   07/10/19 0429  TempSrc: Oral  PainSc:                  [name]

## 2019-07-11 LAB — BASIC METABOLIC PANEL
Anion gap: 16 — ABNORMAL HIGH (ref 5–15)
BUN: 17 mg/dL (ref 8–23)
CO2: 29 mmol/L (ref 22–32)
Calcium: 8 mg/dL — ABNORMAL LOW (ref 8.9–10.3)
Chloride: 96 mmol/L — ABNORMAL LOW (ref 98–111)
Creatinine, Ser: 0.95 mg/dL (ref 0.44–1.00)
GFR calc Af Amer: >60 mL/min (ref >60)
GFR calc non Af Amer: >60 mL/min (ref >60)
Glucose, Bld: 70 mg/dL (ref 70–99)
Potassium: 3.4 mmol/L — ABNORMAL LOW (ref 3.5–5.1)
Sodium: 141 mmol/L (ref 135–145)

## 2019-07-11 LAB — CBC
HCT: 23.4 % — ABNORMAL LOW (ref 36.0–46.0)
Hemoglobin: 7.6 g/dL — ABNORMAL LOW (ref 12.0–15.0)
MCH: 30.2 pg (ref 26.0–34.0)
MCHC: 32.5 g/dL (ref 30.0–36.0)
MCV: 92.9 fL (ref 80.0–100.0)
Platelets: 120 10*3/uL — ABNORMAL LOW (ref 150–400)
RBC: 2.52 MIL/uL — ABNORMAL LOW (ref 3.87–5.11)
RDW: 11.9 % (ref 11.5–15.5)
WBC: 8.8 10*3/uL (ref 4.0–10.5)
nRBC: 0 % (ref 0.0–0.2)

## 2019-07-11 MED ORDER — KCL IN DEXTROSE-NACL 40-5-0.9 MEQ/L-%-% IV SOLN
INTRAVENOUS | Status: DC
  Administered 2019-07-11 – 2019-07-12 (×3): via INTRAVENOUS
  Filled 2019-07-11 (×7): qty 1000

## 2019-07-11 NOTE — Progress Notes (Addendum)
[facility] Hospital Day(s): 3.   Post op day(s): 3 Days Post-Op.   Interval History:  Patient seen and examined no acute events or new complaints overnight.  Patient reports she is feeling much better this morning No fever, chills, nausea or emesis.  NGT with 2.1 L out in last 24 hours Good U/O - 1.4 L  No BM recorded, "a few" episodes of flatus Mobilized last night  Vital signs in last 24 hours: [min-max] current  Temp:  [98.4 F (36.9 C)-99.2 F (37.3 C)] 99.1 F (37.3 C) (11/18 0444) Pulse Rate:  [84-97] 97 (11/18 0444) Resp:  [18-20] 20 (11/18 0444) BP: (142-150)/(63-66) 150/63 (11/18 0444) SpO2:  [93 %-94 %] 94 % (11/18 0444)     Height: 5' (152.4 cm) Weight: 54.4 kg BMI (Calculated): 23.44   Intake/Output last 2 shifts:  11/17 0701 - 11/18 0700 In: 3198.5 [I.V.:2946.8; IV Piggyback:251.7] Out: 3550 [Urine:1400; Emesis/NG output:2150]   Physical Exam:  Constitutional: alert, cooperative and no distress HEENT:NGT in place Respiratory: breathing non-labored at rest  Cardiovascular: regular rate and sinus rhythm  Gastrointestinal: soft,incisional soreness, and non-distended. No rebound/guarding Integumentary:Laparotomy incision is CDI with staples, no erythema, no drainage   Labs:  CBC Latest Ref Rng & Units 07/10/2019 07/09/2019 07/08/2019  WBC 4.0 - 10.5 K/uL 12.6(H) 12.4(H) 13.9(H)  Hemoglobin 12.0 - 15.0 g/dL 8.7(L) 11.8(L) 14.1  Hematocrit 36.0 - 46.0 % 25.5(L) 33.0(L) 41.9  Platelets 150 - 400 K/uL 120(L) 157 219   CMP Latest Ref Rng & Units 07/10/2019 07/09/2019 07/08/2019  Glucose 70 - 99 mg/dL 99 242(H) -  BUN 8 - 23 mg/dL 20 18 -  Creatinine 0.44 - 1.00 mg/dL 1.12(H) 0.81 0.76  Sodium 135 - 145 mmol/L 142 138 -  Potassium 3.5 - 5.1 mmol/L 3.8 4.1 -  Chloride 98 - 111 mmol/L 105 106 -  CO2 22 - 32 mmol/L 30 26 -  Calcium 8.9 - 10.3 mg/dL 8.2(L) 8.1(L) -  Total Protein 6.5 - 8.1 g/dL - 4.7(L) -  Total  Bilirubin 0.3 - 1.2 mg/dL - 0.4 -  Alkaline Phos 38 - 126 U/L - 31(L) -  AST 15 - 41 U/L - 25 -  ALT 0 - 44 U/L - 13 -     Imaging studies: No new pertinent imaging studies   Assessment/Plan:  63 y.o. female still with high output NGT without significant and consistent bowel fucntion 3 Days Post-Op s/p exploratory laparotomy, adhesiolysis, and small bowel resectionfor SBO with necrotic bowel.   - ContinueNPO + IVF  - Given high output, continue NGT decompression: monitor output -- If this slows we can consider clamping trial prior to removing             - Monitor Hgb - suspect dilutional, no evidence of bleeding - pain control prn (minimize narcotics if possible), antiemetics prn - monitor abdominal examination; on-going bowel function - mobilization encouraged - medical management of comorbidities  - DVT prophylaxis  All of the above findings and recommendations were discussed with the patient, and the medical team, and all of patient's questions were answered toherexpressed satisfaction.  -- Edison Simon, PA-C [facility] 07/11/2019, 7:25 AM [phone] M-F: 7am - 4pm  I saw and evaluated the patient.  I agree with the above documentation, exam, and plan, which I have edited where appropriate. Fredirick Maudlin  10:17 AM

## 2019-07-12 LAB — BASIC METABOLIC PANEL
Anion gap: 9 (ref 5–15)
BUN: 15 mg/dL (ref 8–23)
CO2: 27 mmol/L (ref 22–32)
Calcium: 8.1 mg/dL — ABNORMAL LOW (ref 8.9–10.3)
Chloride: 107 mmol/L (ref 98–111)
Creatinine, Ser: 0.72 mg/dL (ref 0.44–1.00)
GFR calc Af Amer: >60 mL/min (ref >60)
GFR calc non Af Amer: >60 mL/min (ref >60)
Glucose, Bld: 107 mg/dL — ABNORMAL HIGH (ref 70–99)
Potassium: 3.8 mmol/L (ref 3.5–5.1)
Sodium: 143 mmol/L (ref 135–145)

## 2019-07-12 LAB — CBC
HCT: 23.8 % — ABNORMAL LOW (ref 36.0–46.0)
Hemoglobin: 7.8 g/dL — ABNORMAL LOW (ref 12.0–15.0)
MCH: 30.7 pg (ref 26.0–34.0)
MCHC: 32.8 g/dL (ref 30.0–36.0)
MCV: 93.7 fL (ref 80.0–100.0)
Platelets: 146 10*3/uL — ABNORMAL LOW (ref 150–400)
RBC: 2.54 MIL/uL — ABNORMAL LOW (ref 3.87–5.11)
RDW: 11.9 % (ref 11.5–15.5)
WBC: 6.5 10*3/uL (ref 4.0–10.5)
nRBC: 0 % (ref 0.0–0.2)

## 2019-07-12 NOTE — Progress Notes (Signed)
[facility] Hospital Day(s): 4.   Post op day(s): 4 Days Post-Op.   Interval History:  Patient seen and examined no acute events or new complaints overnight.  Patient reports she continues to improve. No abdominal pain, nausea, or emesis She continues to pass flatus multiple times Electrolytes, renal function improved. Hgb stable NGT 700 ccs Mobilizing well  Vital signs in last 24 hours: [min-max] current  Temp:  [98.4 F (36.9 C)-99 F (37.2 C)] 98.4 F (36.9 C) (11/19 0514) Pulse Rate:  [76-85] 76 (11/19 0514) Resp:  [16] 16 (11/19 0514) BP: (134-142)/(53-66) 134/53 (11/19 0514) SpO2:  [95 %-96 %] 95 % (11/19 0514)     Height: 5' (152.4 cm) Weight: 54.4 kg BMI (Calculated): 23.44   Intake/Output last 2 shifts:  11/18 0701 - 11/19 0700 In: 1180.6 [I.V.:1023.5; IV Piggyback:157.1] Out: 1900 [Urine:1200; Emesis/NG output:700]   Physical Exam:  Constitutional: alert, cooperative and no distress HEENT:NGT in place Respiratory: breathing non-labored at rest  Cardiovascular: regular rate and sinus rhythm  Gastrointestinal: soft,incisional soreness, and non-distended. No rebound/guarding Integumentary:Laparotomy incision is CDI with staples, no erythema, no drainage   Labs:  CBC Latest Ref Rng & Units 07/11/2019 07/10/2019 07/09/2019  WBC 4.0 - 10.5 K/uL 8.8 12.6(H) 12.4(H)  Hemoglobin 12.0 - 15.0 g/dL 7.6(L) 8.7(L) 11.8(L)  Hematocrit 36.0 - 46.0 % 23.4(L) 25.5(L) 33.0(L)  Platelets 150 - 400 K/uL 120(L) 120(L) 157   CMP Latest Ref Rng & Units 07/11/2019 07/10/2019 07/09/2019  Glucose 70 - 99 mg/dL 70 99 242(H)  BUN 8 - 23 mg/dL 17 20 18   Creatinine 0.44 - 1.00 mg/dL 0.95 1.12(H) 0.81  Sodium 135 - 145 mmol/L 141 142 138  Potassium 3.5 - 5.1 mmol/L 3.4(L) 3.8 4.1  Chloride 98 - 111 mmol/L 96(L) 105 106  CO2 22 - 32 mmol/L 29 30 26   Calcium 8.9 - 10.3 mg/dL 8.0(L) 8.2(L) 8.1(L)  Total Protein 6.5 - 8.1 g/dL - - 4.7(L)   Total Bilirubin 0.3 - 1.2 mg/dL - - 0.4  Alkaline Phos 38 - 126 U/L - - 31(L)  AST 15 - 41 U/L - - 25  ALT 0 - 44 U/L - - 13     Imaging studies: No new pertinent imaging studies   Assessment/Plan:  63 y.o. female overall doing well with improvement in bowel function and decreased NGT output 4 Days Post-Op s/p exploratory laparotomy, adhesiolysis, and small bowel resectionfor SBO with necrotic bowel.   - ContinueNPO + IVF  - NGT clamped at 0900, check residuals at 1300. If less than 150 ccs will remove NGT and start CLD - pain control prn (minimize narcotics if possible), antiemetics prn - monitor abdominal examination; on-going bowel function - mobilization encouraged - medical management of comorbidities  - DVT prophylaxis  All of the above findings and recommendations were discussed with the patient, and the medical team, and all of patient's questions were answered toherexpressed satisfaction.  -- Edison Simon, PA-C [facility] 07/12/2019, 7:28 AM [phone] M-F: 7am - 4pm  I saw and evaluated the patient.  I agree with the above documentation, exam, and plan, which I have edited where appropriate. Edison Simon  7:28 AM

## 2019-07-13 NOTE — Progress Notes (Signed)
Pt ambulates frequently and denies pain. Pt denies nausea/vomiting with full liquid diet and will advance to soft diet tonight.   Marry Guan, RN 07/13/2019 5:40 PM

## 2019-07-13 NOTE — Progress Notes (Addendum)
[facility] Hospital Day(s): 5.   Post op day(s): 5 Days Post-Op.   Interval History:  Patient seen and examined no acute events or new complaints overnight.  Patient reports she is feeling great this morning Passed NGT clamping trial yesterday, tube removed. Started on CLD which she has tolerated well No abdominal pain, distension, nausea, or emesis.  She endorses bowel function and diarrhea. Only other complaint is trace peripheral edema   Vital signs in last 24 hours: [min-max] current  Temp:  [98.2 F (36.8 C)-98.6 F (37 C)] 98.4 F (36.9 C) (11/20 0434) Pulse Rate:  [71-73] 72 (11/20 0434) Resp:  [16] 16 (11/20 0434) BP: (118-159)/(65-91) 142/66 (11/20 0434) SpO2:  [97 %-100 %] 98 % (11/20 0434)     Height: 5' (152.4 cm) Weight: 54.4 kg BMI (Calculated): 23.44   Intake/Output last 2 shifts:  11/19 0701 - 11/20 0700 In: 615.5 [I.V.:603.1; IV Piggyback:12.4] Out: 325 [Urine:300; Emesis/NG output:25]   Physical Exam:  Constitutional: alert, cooperative and no distress Respiratory: breathing non-labored at rest  Cardiovascular: regular rate and sinus rhythm  Gastrointestinal: soft,incisional soreness, and non-distended. No rebound/guarding Integumentary:Laparotomy incisionis CDI with staples, no erythema, no drainage Musculoskeletal: 1+ pitting edema bilateral LE   Labs:  CBC Latest Ref Rng & Units 07/12/2019 07/11/2019 07/10/2019  WBC 4.0 - 10.5 K/uL 6.5 8.8 12.6(H)  Hemoglobin 12.0 - 15.0 g/dL 7.8(L) 7.6(L) 8.7(L)  Hematocrit 36.0 - 46.0 % 23.8(L) 23.4(L) 25.5(L)  Platelets 150 - 400 K/uL 146(L) 120(L) 120(L)   CMP Latest Ref Rng & Units 07/12/2019 07/11/2019 07/10/2019  Glucose 70 - 99 mg/dL 107(H) 70 99  BUN 8 - 23 mg/dL 15 17 20   Creatinine 0.44 - 1.00 mg/dL 0.72 0.95 1.12(H)  Sodium 135 - 145 mmol/L 143 141 142  Potassium 3.5 - 5.1 mmol/L 3.8 3.4(L) 3.8  Chloride 98 - 111 mmol/L 107 96(L) 105  CO2 22 - 32  mmol/L 27 29 30   Calcium 8.9 - 10.3 mg/dL 8.1(L) 8.0(L) 8.2(L)  Total Protein 6.5 - 8.1 g/dL - - -  Total Bilirubin 0.3 - 1.2 mg/dL - - -  Alkaline Phos 38 - 126 U/L - - -  AST 15 - 41 U/L - - -  ALT 0 - 44 U/L - - -     Imaging studies: No new pertinent imaging studies   Assessment/Plan: 63 y.o. female overall doing well with return of bowel function 5 Days Post-Op s/p exploratory laparotomy, adhesiolysis, and small bowel resectionfor SBO with necrotic bowel   - ADAT, discontinue IVF   - pain control prn (minimize narcotics if possible), antiemetics prn - monitor abdominal examination; on-going bowel function - mobilization encouraged - medical management of comorbidities  - DVT prophylaxis    - Discharge planning: Hopefully home tomorrow if tolerates diet advancement   All of the above findings and recommendations were discussed with the patient, and the medical team, and all of patient's questions were answered to her expressed satisfaction.  -- Edison Simon, PA-C [facility] 07/13/2019, 7:19 AM [phone] M-F: 7am - 4pm  I saw and evaluated the patient.  I agree with the above documentation, exam, and plan, which I have edited where appropriate. Fredirick Maudlin  1:47 PM

## 2019-07-14 MED ORDER — AMOXICILLIN-POT CLAVULANATE 875-125 MG PO TABS: 1.0000 | ORAL_TABLET | Freq: Two times a day (BID) | ORAL | 0 refills | Status: AC

## 2019-07-14 MED ORDER — HYDROCODONE-ACETAMINOPHEN 5-325 MG PO TABS: 1.0000 | ORAL_TABLET | Freq: Four times a day (QID) | ORAL | 0 refills | Status: DC | PRN

## 2019-07-14 MED ORDER — ONDANSETRON 4 MG PO TBDP: 4.0000 mg | ORAL_TABLET | Freq: Four times a day (QID) | ORAL | 0 refills | Status: DC | PRN

## 2019-07-14 NOTE — Discharge Summary (Signed)
Patient ID: Jordan Mann MRN: [id number] DOB/AGE: 63/29/1957 63 y.o.  Admit date: 07/08/2019 Discharge date: 07/14/2019   Discharge Diagnoses:  Active Problems:   SBO (small bowel obstruction) (HCC)   Procedures: Exploratory laparotomy and bowel resection  Hospital Course:   63 year old female came into the hospital nausea and vomiting consistent with small bowel obstruction and potential closed-loop.  Patient continued to have abdominal pain and she was given a short trial of medical therapy which she failed.  She was taken to the operating room by Dr. Celine Ahr for exploratory laparotomy revealing evidence of necrotic bowel requiring small bowel resection. She did stay in the hospital for 5 more days after surgery given transient ileus and need for NG tube and fluid replacement.  Her NG tube was removed and she was able to pass gas.  Her diet was slowly advanced and she was kept on appropriate antibiotics due to the necrosis.  Diet was advanced which she tolerated very well. At The time of discharge the patient was ambulating,  pain was controlled.  Her vital signs were stable and she was afebrile.   physical exam at discharge showed a pt  in no acute distress.  Awake and alert.  Abdomen: Soft incisions healing well without infection or peritonitis.  Extremities well-perfused and no edema.  Condition of the patient the time of discharge was stable. Follow-up in a week or so for staple removal     Disposition: Discharge disposition: 01-Home or Self Care       Discharge Instructions    Call MD for:  difficulty breathing, headache or visual disturbances   Complete by: As directed    Call MD for:  extreme fatigue   Complete by: As directed    Call MD for:  hives   Complete by: As directed    Call MD for:  persistant dizziness or light-headedness   Complete by: As directed    Call MD for:  persistant nausea and vomiting   Complete by: As directed    Call MD for:  redness,  tenderness, or signs of infection (pain, swelling, redness, odor or green/yellow discharge around incision site)   Complete by: As directed    Call MD for:  severe uncontrolled pain   Complete by: As directed    Call MD for:  temperature >100.4   Complete by: As directed    Diet - low sodium heart healthy   Complete by: As directed    Discharge instructions   Complete by: As directed    Shower daily,   Increase activity slowly   Complete by: As directed    Lifting restrictions   Complete by: As directed    20 lbs x 6 wks     Allergies as of 07/14/2019      Reactions   Prednisone    Make her feel nauseous and "funny in the head"      Medication List    TAKE these medications   amoxicillin-clavulanate 875-125 MG tablet Commonly known as: Augmentin Take 1 tablet by mouth 2 (two) times daily for 5 days.   HYDROcodone-acetaminophen 5-325 MG tablet Commonly known as: NORCO/VICODIN Take 1 tablet by mouth every 6 (six) hours as needed for moderate pain.   mirabegron ER 50 MG Tb24 tablet Commonly known as: MYRBETRIQ Take 50 mg by mouth.   mometasone 50 MCG/ACT nasal spray Commonly known as: NASONEX SHAKE LIQUID WELL AND USE 2 SPRAYS IN EACH NOSTRIL EVERY DAY   ondansetron 4 MG  disintegrating tablet Commonly known as: ZOFRAN-ODT Take 1 tablet (4 mg total) by mouth every 6 (six) hours as needed for nausea.   Rocklatan 0.02-0.005 % Soln Generic drug: Netarsudil-Latanoprost INT 1 GTT IN OU QHS   rosuvastatin 5 MG tablet Commonly known as: CRESTOR TK 1 T PO QD   traZODone 50 MG tablet Commonly known as: DESYREL Take 50 mg by mouth.      Follow-up Information    Fredirick Maudlin, MD. Schedule an appointment as soon as possible for a visit in 1 week(s).   Specialty: General Surgery Contact information: [address] [place] [address] [phone]            Caroleen Hamman, MD FACS

## 2019-07-14 NOTE — Progress Notes (Signed)
MD ordered patient to be discharged home.  Discharge instructions were reviewed with the patient and she voiced understanding.  Patient instructed on making follow-up appointment.   Prescriptions given to the patient.  IV was removed with catheter intact.  All patients questions were answered.  Patient left via wheelchair escorted by NT

## 2019-07-23 ENCOUNTER — Encounter: Payer: Self-pay | Admitting: Physician Assistant

## 2019-07-23 ENCOUNTER — Ambulatory Visit (INDEPENDENT_AMBULATORY_CARE_PROVIDER_SITE_OTHER): Payer: BC Managed Care – PPO | Admitting: Physician Assistant

## 2019-07-23 ENCOUNTER — Other Ambulatory Visit: Payer: Self-pay

## 2019-07-23 VITALS — BP 146/85 | HR 76 | Temp 95.2°F | Resp 12 | Ht 60.0 in | Wt 107.8 lb

## 2019-07-23 DIAGNOSIS — K56609 Unspecified intestinal obstruction, unspecified as to partial versus complete obstruction: Secondary | ICD-10-CM

## 2019-07-23 DIAGNOSIS — Z09 Encounter for follow-up examination after completed treatment for conditions other than malignant neoplasm: Secondary | ICD-10-CM

## 2019-07-23 NOTE — Patient Instructions (Addendum)
We removed the staples today and placed steri-strips. These will begin to fall off within 7-10 days.  Please call office if you have questions or concerns.     GENERAL POST-OPERATIVE PATIENT INSTRUCTIONS   WOUND CARE INSTRUCTIONS:  Keep a dry clean dressing on the wound if there is drainage. The initial bandage may be removed after 24 hours.  Once the wound has quit draining you may leave it open to air.  If clothing rubs against the wound or causes irritation and the wound is not draining you may cover it with a dry dressing during the daytime.  Try to keep the wound dry and avoid ointments on the wound unless directed to do so.  If the wound becomes bright red and painful or starts to drain infected material that is not clear, please contact your physician immediately.  If the wound is mildly pink and has a thick firm ridge underneath it, this is normal, and is referred to as a healing ridge.  This will resolve over the next 4-6 weeks.  BATHING: You may shower if you have been informed of this by your surgeon. However, Please do not submerge in a tub, hot tub, or pool until incisions are completely sealed or have been told by your surgeon that you may do so.  DIET:  You may eat any foods that you can tolerate.  It is a good idea to eat a high fiber diet and take in plenty of fluids to prevent constipation.  If you do become constipated you may want to take a mild laxative or take ducolax tablets on a daily basis until your bowel habits are regular.  Constipation can be very uncomfortable, along with straining, after recent surgery.  ACTIVITY:  You are encouraged to cough and deep breath or use your incentive spirometer if you were given one, every 15-30 minutes when awake.  This will help prevent respiratory complications and low grade fevers post-operatively if you had a general anesthetic.  You may want to hug a pillow when coughing and sneezing to add additional support to the surgical area, if  you had abdominal or chest surgery, which will decrease pain during these times.  You are encouraged to walk and engage in light activity for the next two weeks.  You should not lift more than 20 pounds, until 06/19/2019 as it could put you at increased risk for complications.  Twenty pounds is roughly equivalent to a plastic bag of groceries. At that time- Listen to your body when lifting, if you have pain when lifting, stop and then try again in a few days. Soreness after doing exercises or activities of daily living is normal as you get back in to your normal routine.  MEDICATIONS:  Try to take narcotic medications and anti-inflammatory medications, such as tylenol, ibuprofen, naprosyn, etc., with food.  This will minimize stomach upset from the medication.  Should you develop nausea and vomiting from the pain medication, or develop a rash, please discontinue the medication and contact your physician.  You should not drive, make important decisions, or operate machinery when taking narcotic pain medication.  SUNBLOCK Use sun block to incision area over the next year if this area will be exposed to sun. This helps decrease scarring and will allow you avoid a permanent darkened area over your incision.  QUESTIONS:  Please feel free to call our office if you have any questions, and we will be glad to assist you. [phone]

## 2019-07-23 NOTE — Progress Notes (Signed)
[facility] POST-OP OFFICE VISIT  07/23/2019  HPI: Jordan Mann is a 63 y.o. female 15 days s/p exploratory laparotomy and small bowel resection for SBO with bowel necrosis with Dr Celine Ahr  Doing well No issues with pain No fever, chills, nausea, or emesis Decreased appetite but doing well No issues with bowel function Ambulating well  Vital signs: BP (!) 146/85   Pulse 76   Temp (!) 95.2 F (35.1 C) (Temporal)   Resp 12   Ht 5' (1.524 m)   Wt 107 lb 12.8 oz (48.9 kg)   SpO2 98%   BMI 21.05 kg/m    Physical Exam: Constitutional: Well appearing female, NAD Abdomen: Soft, non-tender, non-distended, no rebound/guarding Skin: Laparotomy incision is CDI with staples (removed), no erythema or drainage   Assessment/Plan: This is a 63 y.o. female 15 days s/p exploratory laparotomy and small bowel resection for SBO with bowel necrosis.   - pain control prn  - removed staples; placed steri-strips  - reviewed wound care and lifting restrictions   - continue to mobilize  - rtc prn  -- Edison Simon, PA-C [facility] 07/23/2019, 11:58 AM [phone] M-F: 7am - 4pm

## 2019-08-06 ENCOUNTER — Encounter: Payer: Self-pay | Admitting: Surgery

## 2019-08-07 ENCOUNTER — Encounter: Payer: Self-pay | Admitting: General Surgery

## 2019-08-07 ENCOUNTER — Other Ambulatory Visit: Payer: Self-pay

## 2019-08-07 ENCOUNTER — Ambulatory Visit (INDEPENDENT_AMBULATORY_CARE_PROVIDER_SITE_OTHER): Payer: BC Managed Care – PPO | Admitting: General Surgery

## 2019-08-07 VITALS — BP 147/76 | HR 59 | Temp 97.4°F | Ht 60.0 in | Wt 109.8 lb

## 2019-08-07 DIAGNOSIS — Z9049 Acquired absence of other specified parts of digestive tract: Secondary | ICD-10-CM | POA: Insufficient documentation

## 2019-08-07 NOTE — Patient Instructions (Signed)
   Follow-up with our office as needed.  Please call and ask to speak with a nurse if you develop questions or concerns.  

## 2019-08-07 NOTE — Progress Notes (Signed)
Jordan Mann is here today for a postoperative visit.  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  She did well after her operation was discharged several days postop.  She was seen by our physician's assistant for staple removal.  She is here today for her postop visit with me.  Since her operation, she states that she has been doing well.  She has resumed most of her usual activities with the exception of those prohibitive to her by her lifting restrictions.  She denies any fevers or chills.  No nausea or vomiting.  She does report constipation, for which she has been taking MiraLAX with good effect.  Her appetite is normal.  Today's Vitals   08/07/19 1041  BP: (!) 147/76  Pulse: (!) 59  Temp: (!) 97.4 F (36.3 C)  SpO2: 98%  Weight: 109 lb 12.8 oz (49.8 kg)  Height: 5' (1.524 m)   Body mass index is 21.44 kg/m.  Focused abdominal exam: Her surgical incision is healing nicely.  There is no erythema, induration, or drainage present.  No hernia with Valsalva.  Impression and plan: This is a 63 year old woman who presented to the emergency department with bowel obstruction.  She was found to have an internal hernia and ischemic bowel, after resection, she did well.  She continues to do well postoperatively.  She may resume all of her usual activities, with the exception that she should continue to refrain from lifting anything heavier than 10 pounds for another 2 weeks.  We will see her on an as-needed basis.

## 2019-12-12 ENCOUNTER — Ambulatory Visit
Admission: RE | Admit: 2019-12-12 | Discharge: 2019-12-12 | Disposition: A | Discharge status: Home / Self Care | Payer: BC Managed Care – PPO | Source: Ambulatory Visit | Attending: Family Medicine | Admitting: Family Medicine

## 2019-12-12 ENCOUNTER — Other Ambulatory Visit: Payer: Self-pay

## 2019-12-12 ENCOUNTER — Other Ambulatory Visit: Payer: Self-pay | Admitting: Family Medicine

## 2019-12-12 ENCOUNTER — Other Ambulatory Visit (HOSPITAL_COMMUNITY): Payer: Self-pay | Admitting: Family Medicine

## 2019-12-12 DIAGNOSIS — M25451 Effusion, right hip: Secondary | ICD-10-CM | POA: Diagnosis not present

## 2019-12-12 IMAGING — US US EXTREM LOW VENOUS*R*
1 series · 13 of 24 positions shown · non-contrast
Comparison: None.

CLINICAL DATA: 64-year-old female with a history of thigh swelling



[Series 1: us extrem low venous*right* · 0.06mm/px · 13 of 34 slices shown]
[im 1/34]
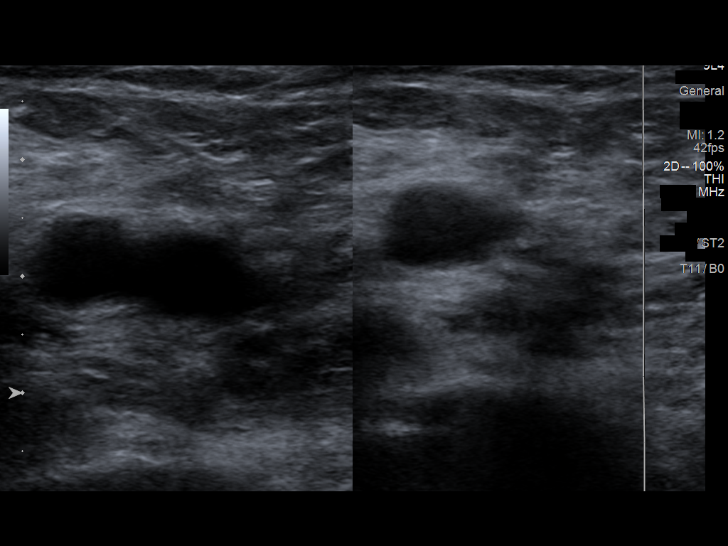
[im 3/34]
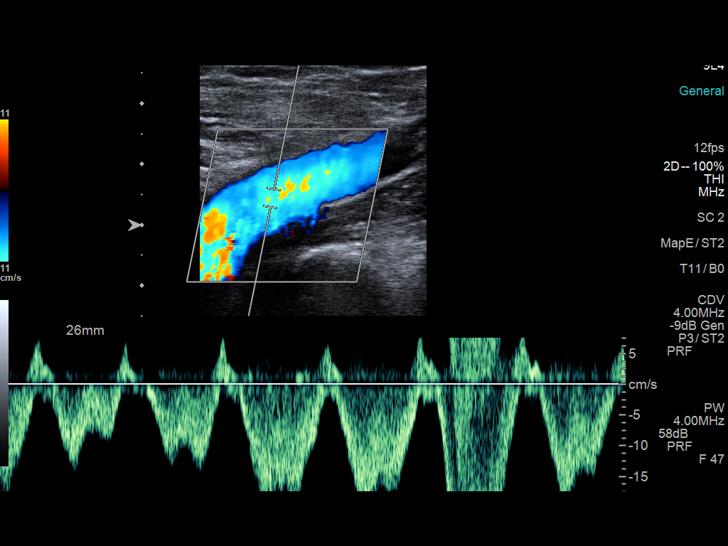
[im 6/34]
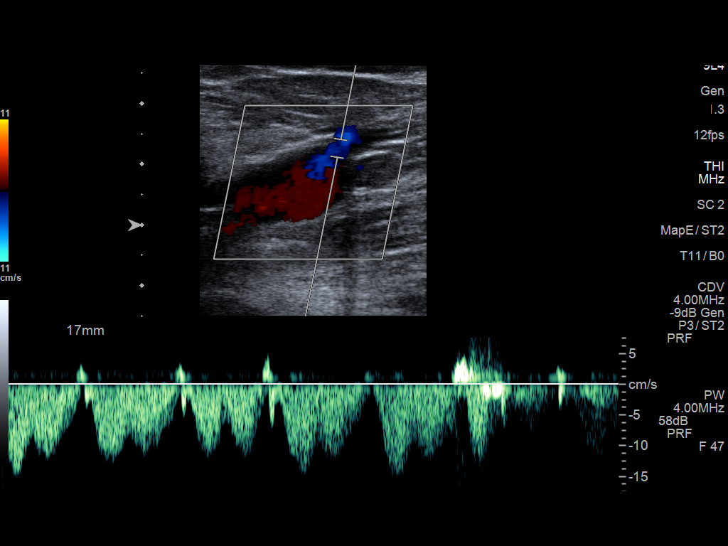
[im 9/34]
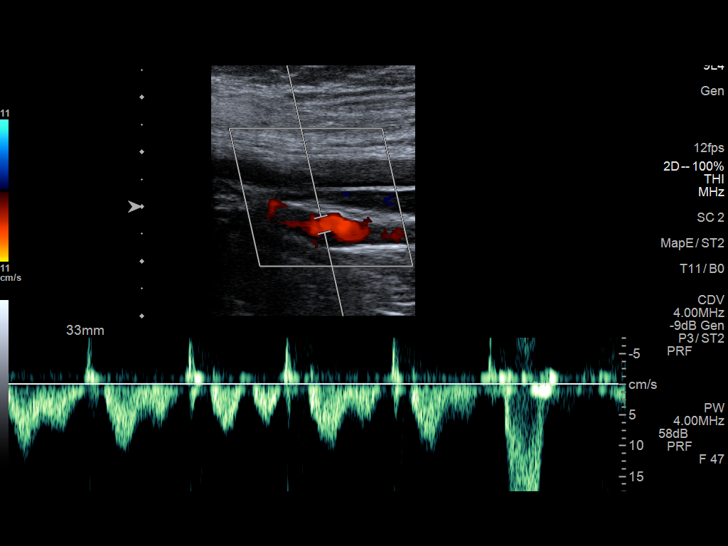
[im 12/34]
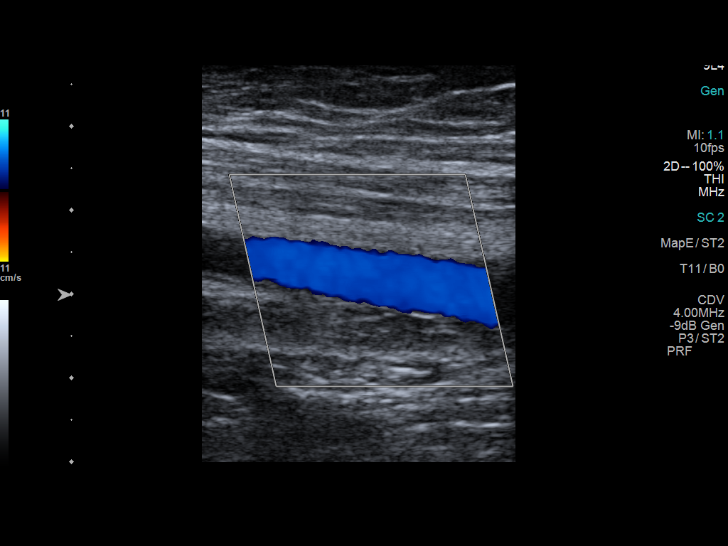
[im 15/34]
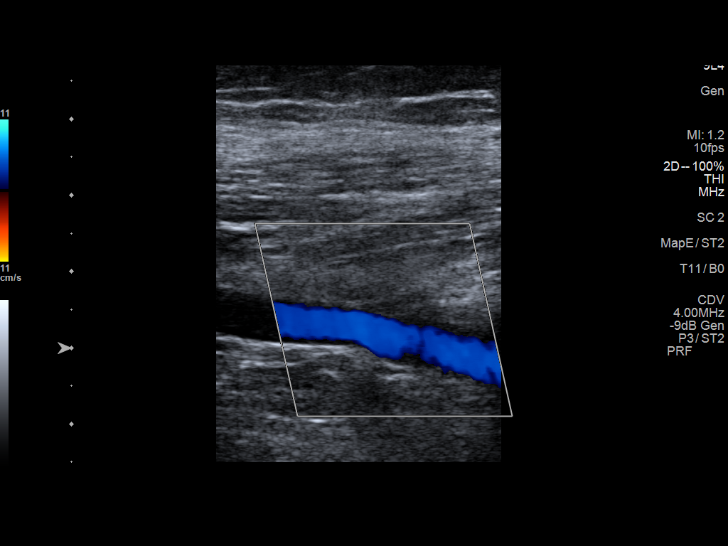
[im 18/34]
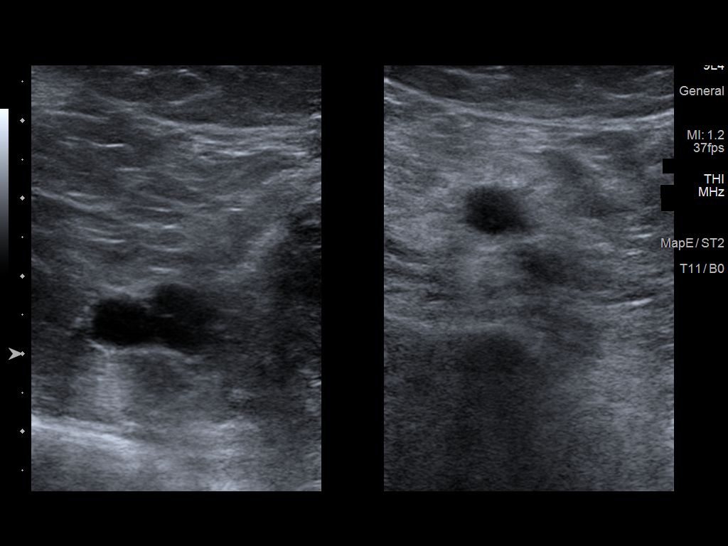
[im 19/34]
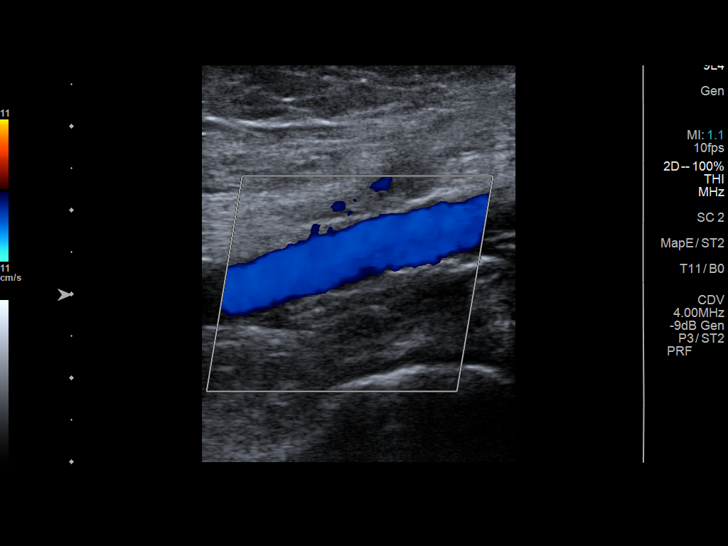
[im 22/34]
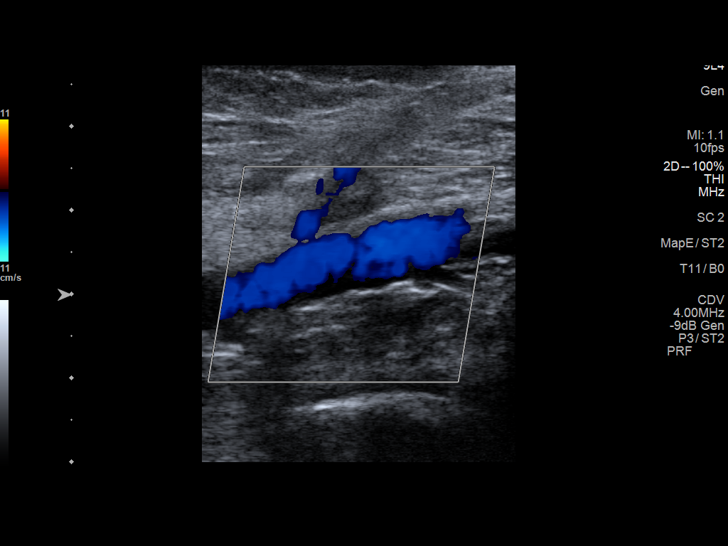
[im 25/34]
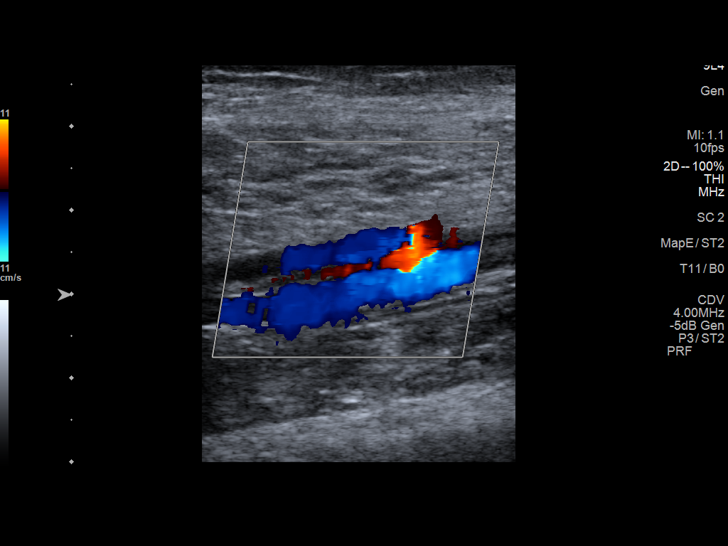
[im 28/34]
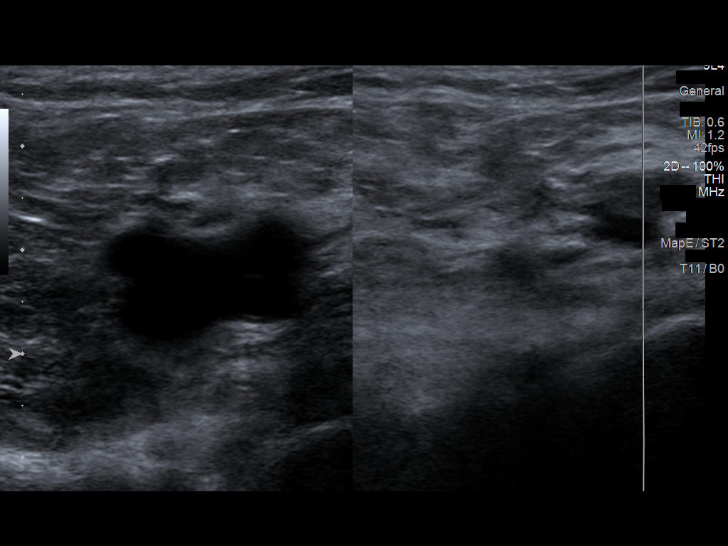
[im 31/34]
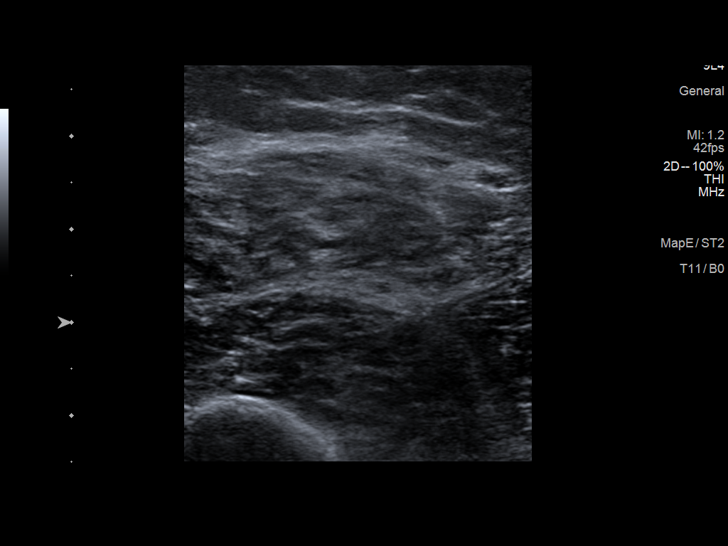
[im 34/34]
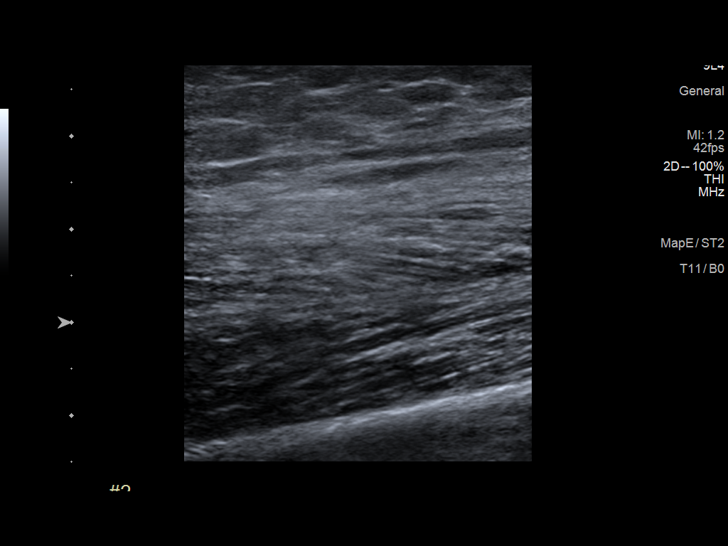

[13 of 24 positions shown; findings below may reference images not displayed]

FINDINGS: Contralateral Common Femoral Vein: Respiratory phasicity is normal
and symmetric with the symptomatic side. No evidence of thrombus.
Normal compressibility.

Common Femoral Vein: No evidence of thrombus. Normal
compressibility, respiratory phasicity and response to augmentation.

Saphenofemoral Junction: No evidence of thrombus. Normal
compressibility and flow on color Doppler imaging.

Profunda Femoral Vein: No evidence of thrombus. Normal
compressibility and flow on color Doppler imaging.

Femoral Vein: No evidence of thrombus. Normal compressibility,
respiratory phasicity and response to augmentation.

Popliteal Vein: No evidence of thrombus. Normal compressibility,
respiratory phasicity and response to augmentation.

Calf Veins: No evidence of thrombus. Normal compressibility and flow
on color Doppler imaging.

Superficial Great Saphenous Vein: No evidence of thrombus. Normal
compressibility and flow on color Doppler imaging.

Other Findings:  None.
IMPRESSION: Sonographic survey of the right lower extremity negative for DVT

## 2020-02-29 ENCOUNTER — Encounter: Payer: Self-pay | Admitting: Certified Nurse Midwife

## 2020-02-29 ENCOUNTER — Ambulatory Visit (INDEPENDENT_AMBULATORY_CARE_PROVIDER_SITE_OTHER): Payer: BLUE CROSS/BLUE SHIELD | Admitting: Certified Nurse Midwife

## 2020-02-29 ENCOUNTER — Other Ambulatory Visit: Payer: Self-pay

## 2020-02-29 VITALS — BP 120/70 | HR 67 | Resp 16 | Ht 60.0 in | Wt 114.8 lb

## 2020-02-29 DIAGNOSIS — Z01419 Encounter for gynecological examination (general) (routine) without abnormal findings: Secondary | ICD-10-CM

## 2020-02-29 DIAGNOSIS — Z1231 Encounter for screening mammogram for malignant neoplasm of breast: Secondary | ICD-10-CM

## 2020-02-29 NOTE — Progress Notes (Signed)
Gynecology Annual Exam  PCP: Maryland Pink, MD  Chief Complaint:  Chief Complaint  Patient presents with  . Gynecologic Exam    annual exam    History of Present Illness:Jordan Mann is a 64 year old Caucasian/White female, G3 P3003, who presents for her annual exam. .   Her menses are absent and she is postmenopausal.  She has had no spotting.   The patient's past medical history is also notable for a history of hypertension, arthritis, OAB, mild depression, glaucoma, basal cell carcinoma of her nose, and elevated cholesterol. 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 Had a trial of transdermal testosterone last year which she states was ineffective.  Since her last annual GYN exam dated 02/19/2019, she had a small bowel obstruction and had an exploratory laparotomy, small bowel resection and appendectomy (07/08/2019). Her Crestor was stopped due to side effects. She is currently on Prednisone for recurrent serous otitis and has a follow up appointment with Dr Richardson Landry.   Her most recent pap smear was obtained 02/19/2019 and was NIL. Her most recent mammogram obtained on 05/25/2019 was normal.  There is no family history of breast cancer.  There is no family history of ovarian cancer.  The patient does do monthly self breast exams.  She had a colonoscopy 05/11/2019 that was normal.  She had a recent DEXA scan obtained in 2019 that revealed osteopenia of the femoral neck (-1.7). FRAX score was 8.8% and 0.9% The patient does not smoke.  The patient does not drink alcohol.  The patient does not use illegal drugs.  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 She tries to get 20,000 steps daily. The  patient may get adequate calcium with her diet. Is somewhat lactose intolerant She had a recent cholesterol screen in April 2020 that was normal.   The patient denies current symptoms of depression.    Review of Systems: Review of Systems  Constitutional:  Negative for chills, fever and weight loss.  HENT: Negative for congestion, sinus pain and sore throat.        Hearing changes due to serous otitis  Eyes: Negative for blurred vision and pain.  Respiratory: Negative for hemoptysis, shortness of breath and wheezing.   Cardiovascular: Negative for chest pain, palpitations and leg swelling.  Gastrointestinal: Negative for abdominal pain, blood in stool, constipation, diarrhea, heartburn, nausea and vomiting.  Genitourinary: Positive for frequency. Negative for dysuria, hematuria and urgency.  Musculoskeletal: Positive for joint pain. Negative for back pain and myalgias.  Skin: Negative for itching and rash.  Neurological: Negative for dizziness, tingling and headaches.  Endo/Heme/Allergies: Positive for environmental allergies (sneezing/coughing). Negative for polydipsia. Does not bruise/bleed easily.       Decreased libido/ sexual arousal   Psychiatric/Behavioral: Negative for depression. The patient is not nervous/anxious and does not have insomnia.     Past Medical History:  Past Medical History:  Diagnosis Date  . Arthritis   . Basal cell carcinoma   . Fecal incontinence   . Glaucoma   . History of mammogram   . Hypercholesteremia   . Hyperthyroidism 1990   Graves disease -treated with PTU-now resolved.  . Mild depression ([place])   . Overactive bladder     Past Surgical History:  Past Surgical History:  Procedure Laterality Date  . BASAL CELL CARCINOMA EXCISION  10/2016   Moh's surgery  . COLONOSCOPY  2010  . CYSTOSCOPY  2015  . ESOPHAGOGASTRODUODENOSCOPY  2011  .  LAPAROTOMY N/A 07/08/2019   Procedure: EXPLORATORY LAPAROTOMY possible bowel resection;  Surgeon: Fredirick Maudlin, MD;  Location: [facility] ORS;  Service: General;  Laterality: N/A;  . myringtomy    . TUBAL LIGATION      Family History:  Family History  Problem Relation Age of Onset  . Diabetes type II Mother   . Hypertension Mother   . Cervical cancer Sister  9  . Paranoid behavior Son   . Breast cancer Neg Hx   . Colon cancer Neg Hx   . Ovarian cancer Neg Hx     Social History:  Social History   Socioeconomic History  . Marital status: Married    Spouse name: Not on file  . Number of children: 3  . Years of education: Not on file  . Highest education level: Not on file  Occupational History  . Occupation: Microbiologist houses    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  Tobacco Use  . Smoking status: Never Smoker  . Smokeless tobacco: Never Used  Vaping Use  . Vaping Use: Never used  Substance and Sexual Activity  . Alcohol use: No  . Drug use: No  . Sexual activity: Yes    Birth control/protection: Post-menopausal, Surgical  Other Topics Concern  . Not on file  Social History Narrative  . Not on file   Social Determinants of Health   Financial Resource Strain:   . Difficulty of Paying Living Expenses:   Food Insecurity:   . Worried About Charity fundraiser in the Last Year:   . Arboriculturist in the Last Year:   Transportation Needs:   . Film/video editor (Medical):   Marland Kitchen Lack of Transportation (Non-Medical):   Physical Activity:   . Days of Exercise per Week:   . Minutes of Exercise per Session:   Stress:   . Feeling of Stress :   Social Connections:   . Frequency of Communication with Friends and Family:   . Frequency of Social Gatherings with Friends and Family:   . Attends Religious Services:   . Active Member of Clubs or Organizations:   . Attends Archivist Meetings:   Marland Kitchen Marital Status:   Intimate Partner Violence:   . Fear of Current or Ex-Partner:   . Emotionally Abused:   Marland Kitchen Physically Abused:   . Sexually Abused:     Allergies:  Allergies  Allergen Reactions  . Prednisone     Make her feel nauseous and "funny in the head"    Medications: Current Outpatient Medications:  .  mirabegron ER (MYRBETRIQ) 50 MG TB24 tablet, Take 50 mg by mouth., Disp: , Rfl:  .  mometasone (NASONEX)  50 MCG/ACT nasal spray, SHAKE LIQUID WELL AND USE 2 SPRAYS IN EACH NOSTRIL EVERY DAY, Disp: , Rfl:  .  polyethylene glycol (MIRALAX / GLYCOLAX) 17 g packet, Take 17 g by mouth daily., Disp: , Rfl:  .  ROCKLATAN 0.02-0.005 % SOLN, INT 1 GTT IN OU QHS, Disp: , Rfl:  .  traZODone (DESYREL) 50 MG tablet, Take 50 mg by mouth., Disp: , Rfl:  Physical Exam Vitals: BP 120/70   Pulse 67   Resp 16   Ht 5' (1.524 m)   Wt 114 lb 12.8 oz (52.1 kg)   SpO2 98%   BMI 22.42 kg/m   General: petite WF in NAD HEENT: normocephalic, anicteric Neck: no thyroid enlargement, no palpable nodules, no cervical lymphadenopathy  Pulmonary: No increased work of breathing, CTAB  Cardiovascular: RRR, without murmur  Breast: Breast symmetrical, no tenderness, no palpable nodules or masses, no skin or nipple retraction present, no nipple discharge.  No axillary, infraclavicular or supraclavicular lymphadenopathy. Abdomen: Soft, non-tender, non-distended.  Umbilicus without lesions.  No hepatomegaly or masses palpable. No evidence of hernia. Well healed vertical scar on lower abdomen. Genitourinary:  External: Atrophic changes.  Normal urethral meatus, normal Bartholin's and Skene's glands.    Vagina: Decreased rugae   Cervix: Grossly normal in appearance, no bleeding, non-tender  Uterus: Anteverted, small, normal shape and consistency, mobile, and non-tender  Adnexa: No adnexal masses, non-tender  Rectal: deferred  Lymphatic: no evidence of inguinal lymphadenopathy Extremities: no edema, erythema, or tenderness Neurologic: Grossly intact Psychiatric: mood appropriate, affect full     Assessment: 64 y.o. [id number] well woman exam   Plan:   1) Breast cancer screening - recommend monthly self breast exam and annual mammograms Mammogram was ordered today. Patient to schedule after 05/24/2020  2) Cervical cancer screening - Pap was not done. ASCCP guidelines and rational discussed.  Patient opts for every 3 year  screening interval  3)Colon cancer screening-colonoscopy UTD   4) Routine healthcare maintenance including cholesterol and diabetes screening managed by PCP .  5) Discussed calcium and vitamin D3 requirements.Recommend repeating DEXA next year.  6) RTO in 1 year and prn.  Dalia Heading, CNM

## 2020-04-08 ENCOUNTER — Other Ambulatory Visit: Payer: Self-pay | Admitting: Family Medicine

## 2020-04-08 DIAGNOSIS — R0989 Other specified symptoms and signs involving the circulatory and respiratory systems: Secondary | ICD-10-CM

## 2020-04-10 ENCOUNTER — Ambulatory Visit
Admission: RE | Admit: 2020-04-10 | Discharge: 2020-04-10 | Disposition: A | Discharge status: Home / Self Care | Payer: BC Managed Care – PPO | Source: Ambulatory Visit | Attending: Family Medicine | Admitting: Family Medicine

## 2020-04-10 ENCOUNTER — Other Ambulatory Visit: Payer: Self-pay

## 2020-04-10 DIAGNOSIS — R0989 Other specified symptoms and signs involving the circulatory and respiratory systems: Secondary | ICD-10-CM | POA: Diagnosis present

## 2020-04-10 IMAGING — US US CAROTID DUPLEX BILAT
1 series · 15 of 24 positions shown · non-contrast
Comparison: None.

CLINICAL DATA: 64-year-old female with carotid bruit

EXAM:
BILATERAL CAROTID DUPLEX ULTRASOUND
TECHNIQUE: Gray scale imaging, color Doppler and duplex ultrasound were
performed of bilateral carotid and vertebral arteries in the neck.

[Series 1: us carotid duplex bilat · 15 of 62 slices shown]
[im 1/62]
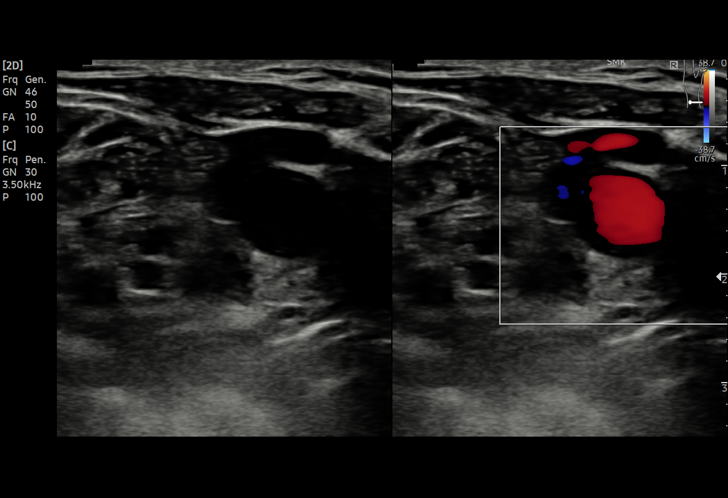
[im 6/62]
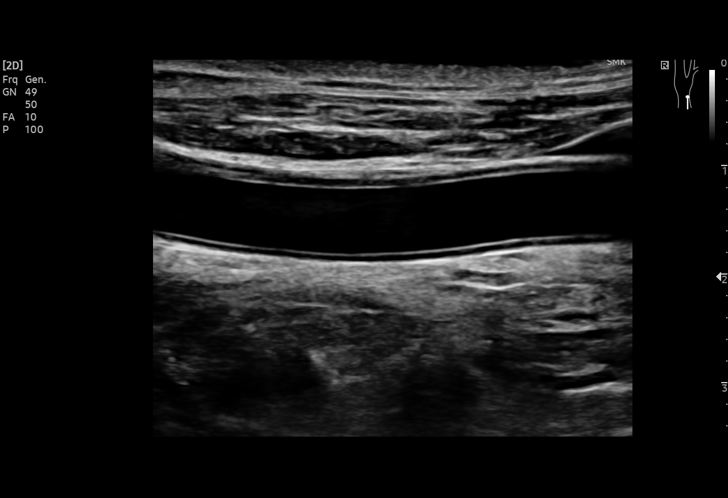
[im 11/62]
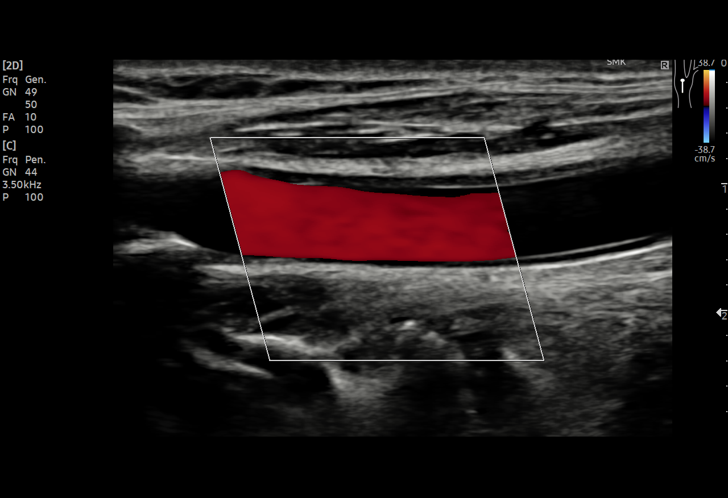
[im 14/62]
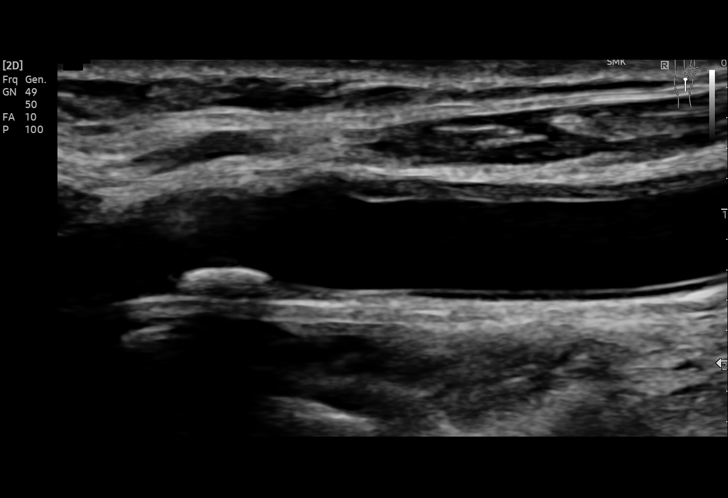
[im 19/62]
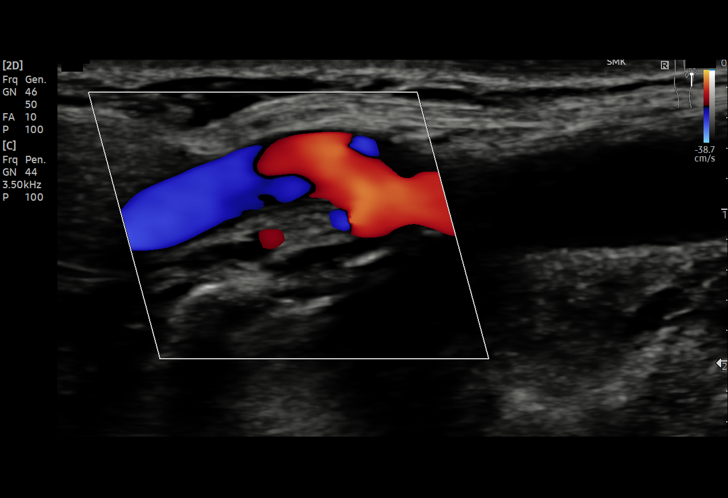
[im 22/62]
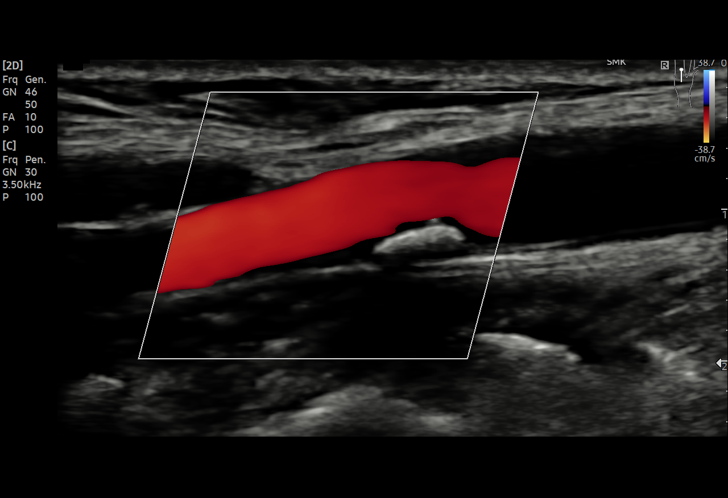
[im 27/62]
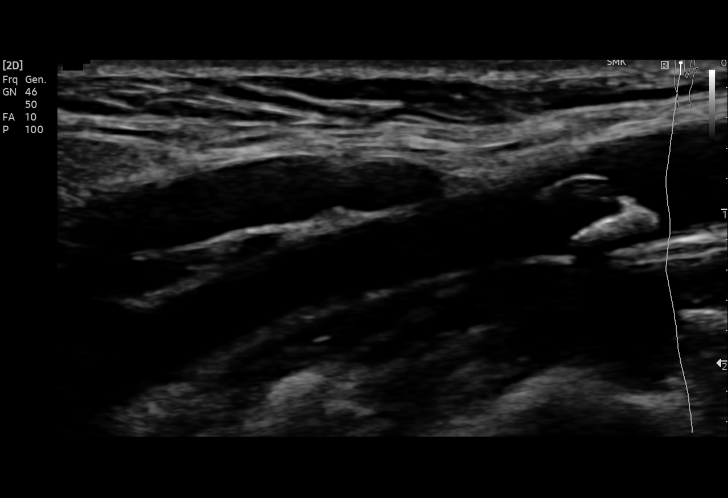
[im 32/62]
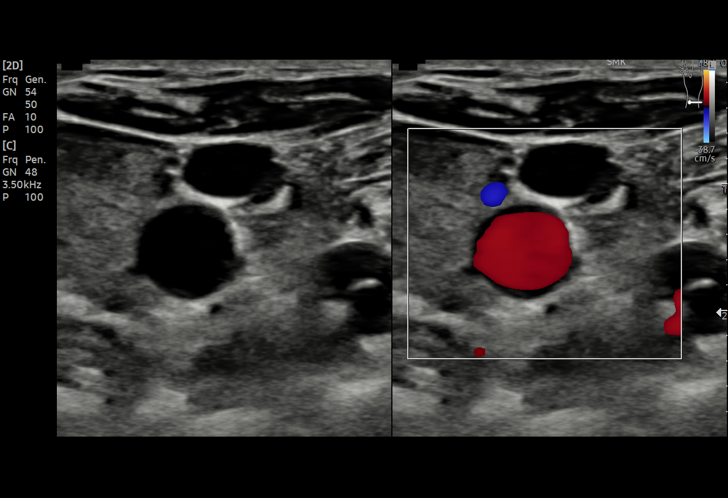
[im 35/62]
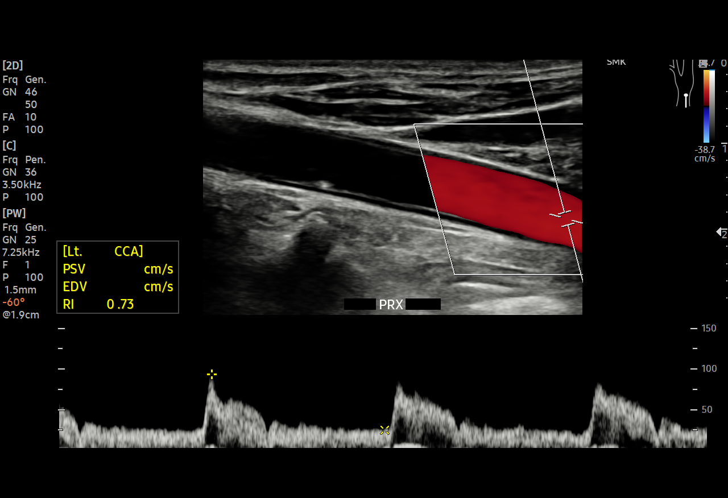
[im 40/62]
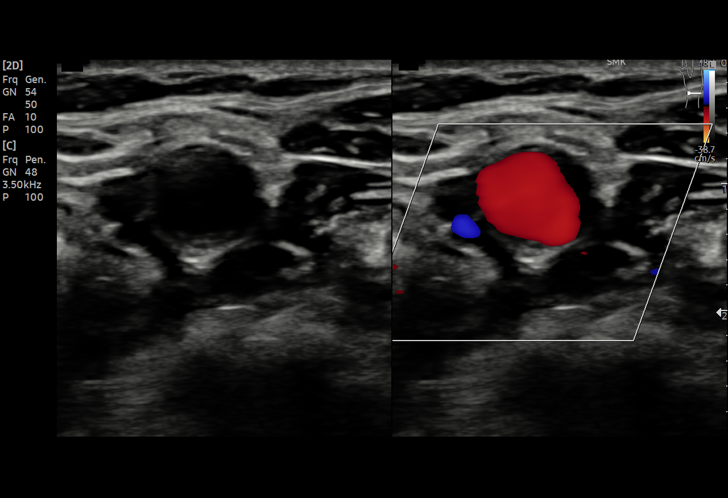
[im 43/62]
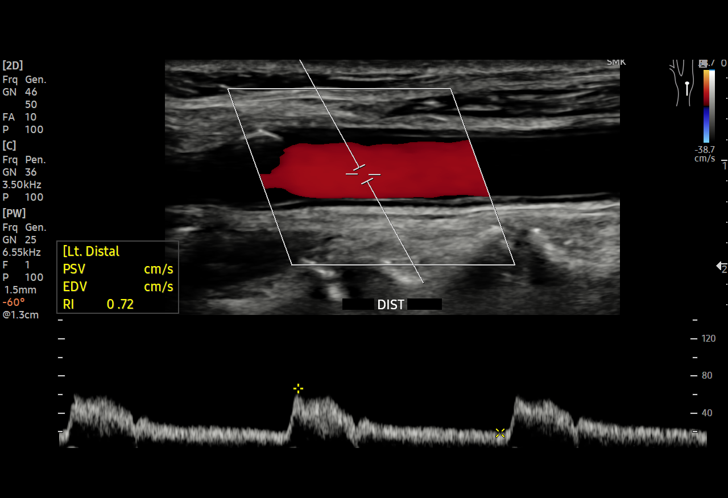
[im 48/62]
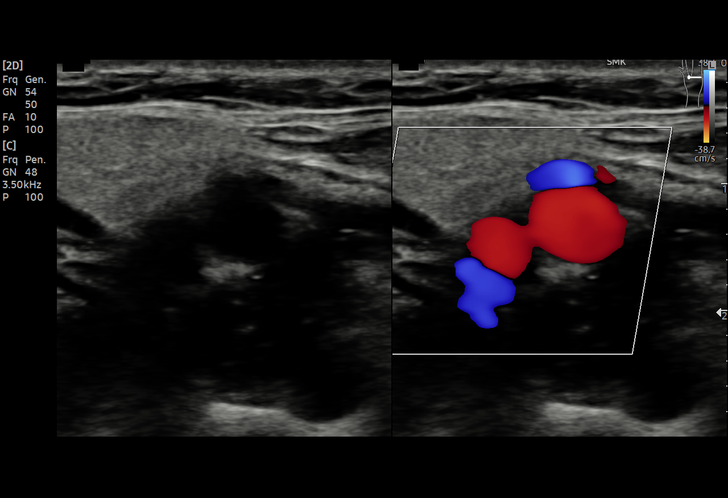
[im 54/62]
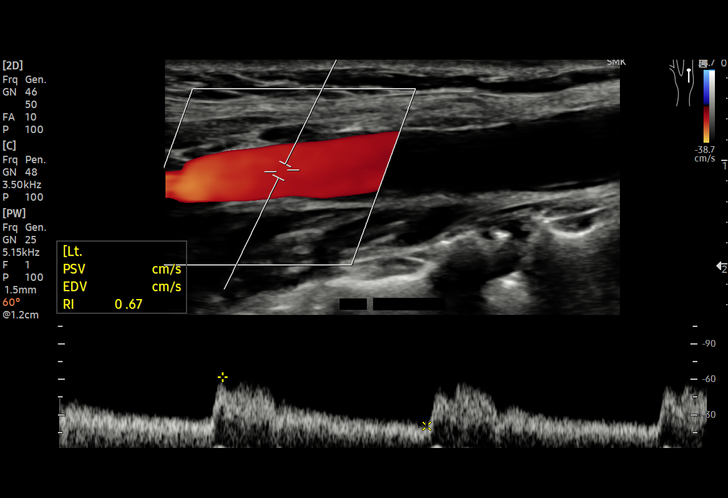
[im 56/62]
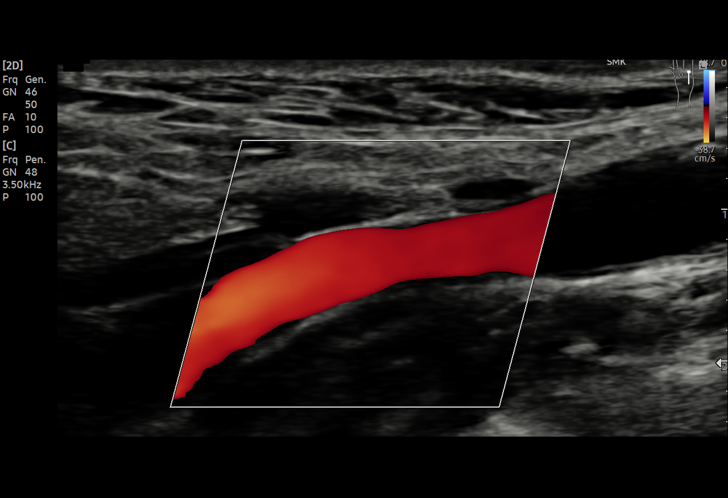
[im 62/62]
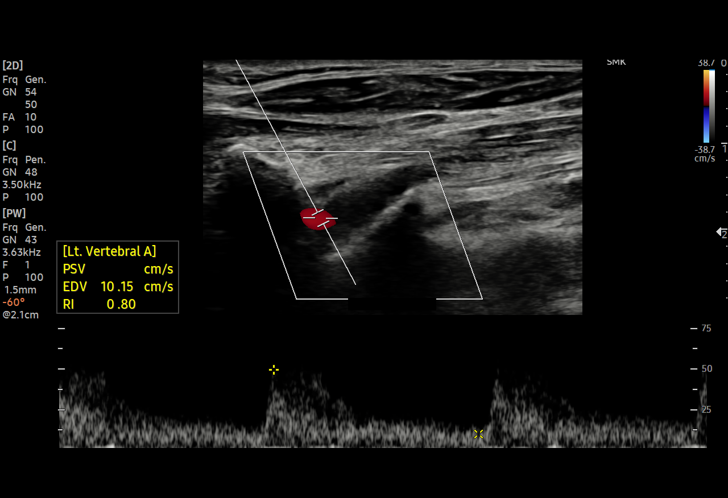

[15 of 24 positions shown; findings below may reference images not displayed]

FINDINGS: Criteria: Quantification of carotid stenosis is based on velocity
parameters that correlate the residual internal carotid diameter
with NASCET-based stenosis levels, using the diameter of the distal
internal carotid lumen as the denominator for stenosis measurement.

The following velocity measurements were obtained:

RIGHT

ICA:  Systolic 92 cm/sec, Diastolic 32 cm/sec

CCA:  73 cm/sec

SYSTOLIC ICA/CCA RATIO:

ECA:  118 cm/sec

LEFT

ICA:  Systolic 75 cm/sec, Diastolic 32 cm/sec

CCA:  82 cm/sec

SYSTOLIC ICA/CCA RATIO:

ECA:  64 cm/sec

Right Brachial SBP: Not acquired

Left Brachial SBP: Not acquired

RIGHT CAROTID ARTERY: No significant calcifications of the right
common carotid artery. Intermediate waveform maintained. Moderate
heterogeneous and partially calcified plaque at the right carotid
bifurcation. No significant lumen shadowing. Low resistance waveform
of the right ICA. No significant tortuosity.

RIGHT VERTEBRAL ARTERY: Antegrade flow with low resistance waveform.

LEFT CAROTID ARTERY: No significant calcifications of the left
common carotid artery. Intermediate waveform maintained. Moderate
heterogeneous and partially calcified plaque at the left carotid
bifurcation. No significant lumen shadowing. Low resistance waveform
of the left ICA. No significant tortuosity.

LEFT VERTEBRAL ARTERY:  Antegrade flow with low resistance waveform.
IMPRESSION: Color duplex indicates moderate heterogeneous and calcified plaque,
with no hemodynamically significant stenosis by duplex criteria in
the extracranial cerebrovascular circulation.

## 2020-06-13 ENCOUNTER — Ambulatory Visit
Admission: RE | Admit: 2020-06-13 | Discharge: 2020-06-13 | Disposition: A | Discharge status: Home / Self Care | Payer: BC Managed Care – PPO | Source: Ambulatory Visit | Attending: Certified Nurse Midwife | Admitting: Certified Nurse Midwife

## 2020-06-13 ENCOUNTER — Other Ambulatory Visit: Payer: Self-pay

## 2020-06-13 DIAGNOSIS — Z1231 Encounter for screening mammogram for malignant neoplasm of breast: Secondary | ICD-10-CM | POA: Insufficient documentation

## 2020-06-13 IMAGING — MG DIGITAL SCREENING BILAT W/ TOMO W/ CAD
8 series · 8 of 24 positions shown · non-contrast
Comparison: Previous exam(s).

CLINICAL DATA: Screening.

EXAM:
DIGITAL SCREENING BILATERAL MAMMOGRAM WITH TOMO AND CAD

[L MLO synth-2D]
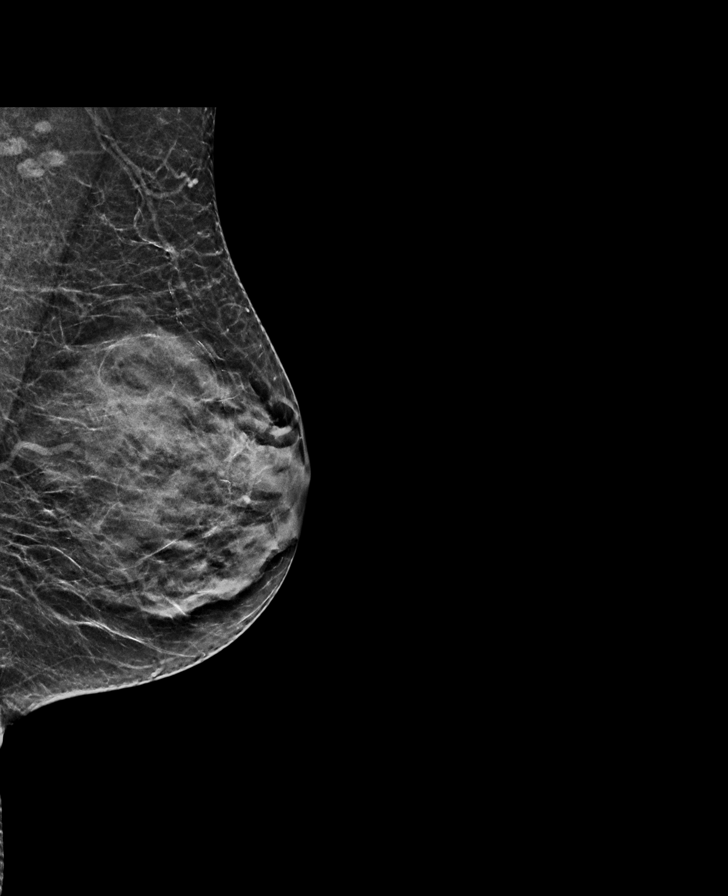

[L CC synth-2D]
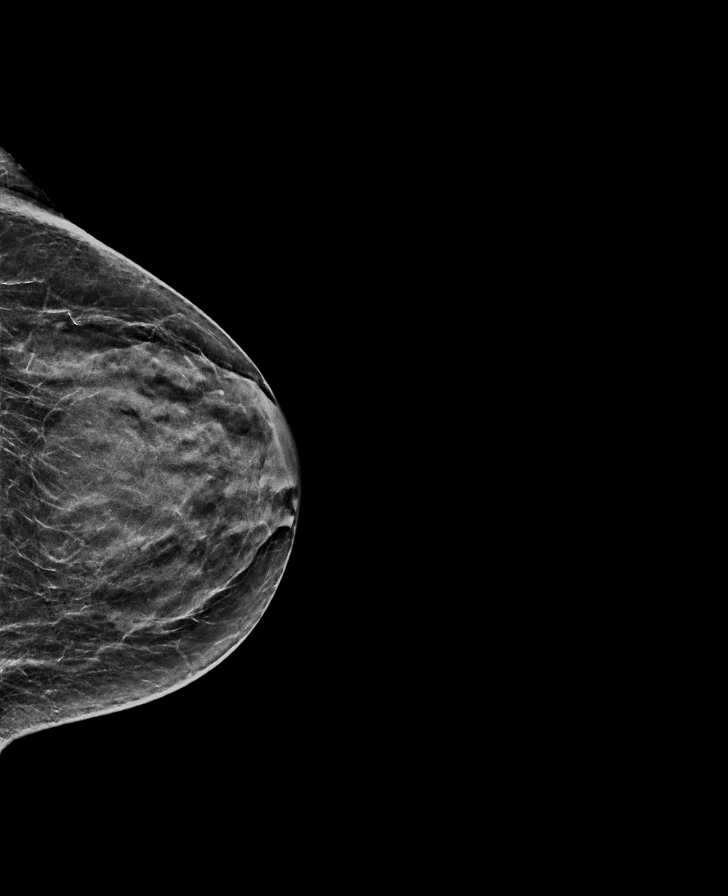

[R MLO synth-2D]
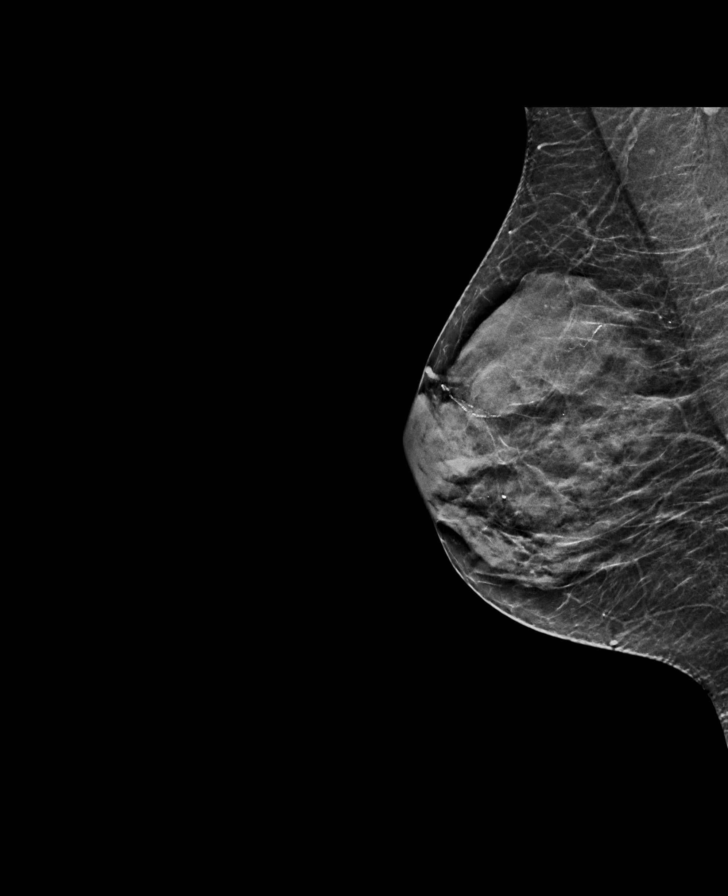

[R CC synth-2D]
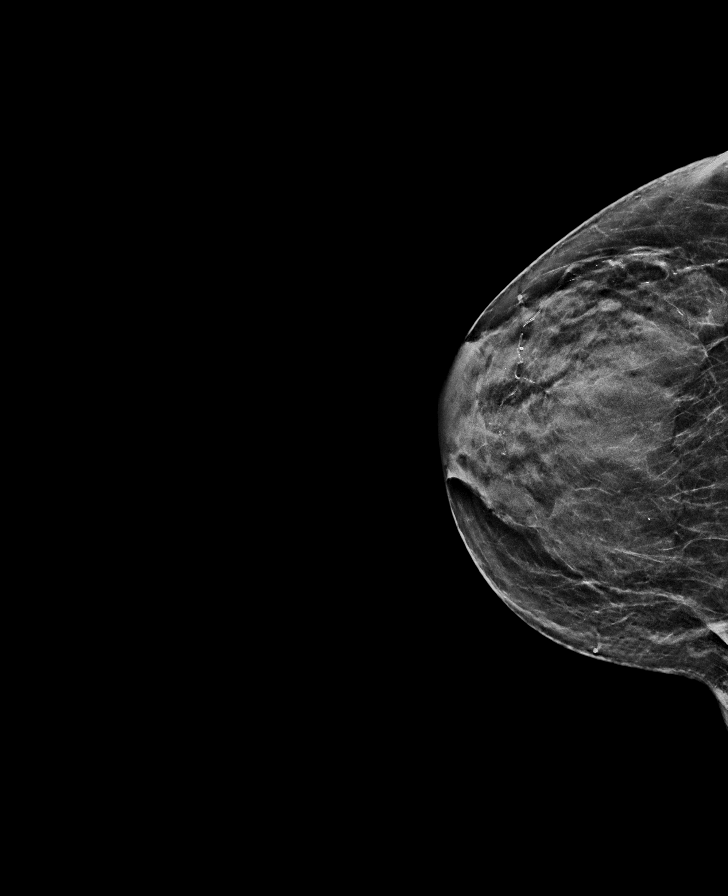

[L CC tomo · tomo slice 25/49.0]
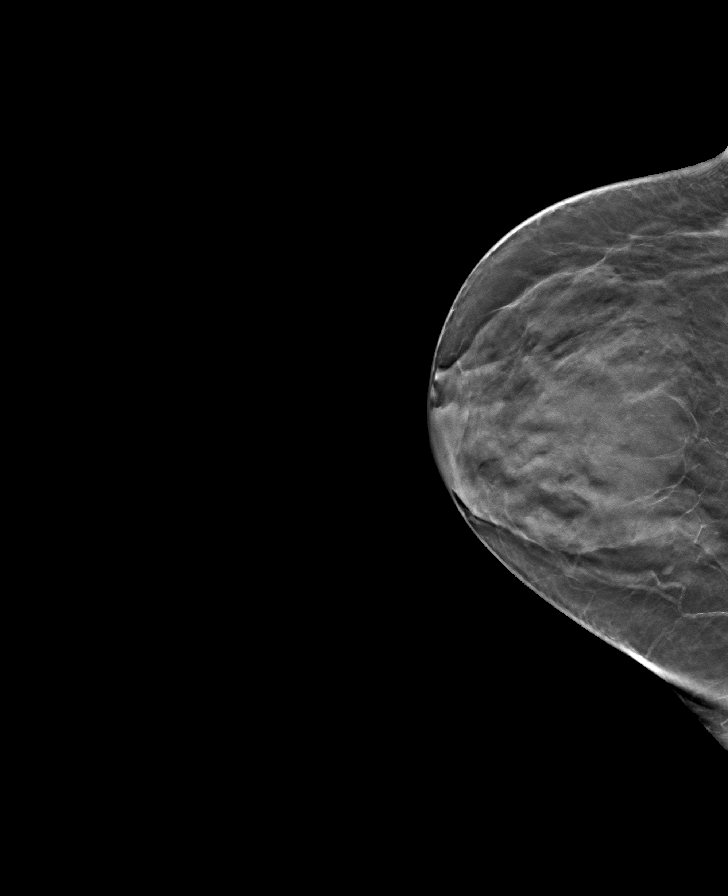

[R MLO tomo · tomo slice 25/48.0]
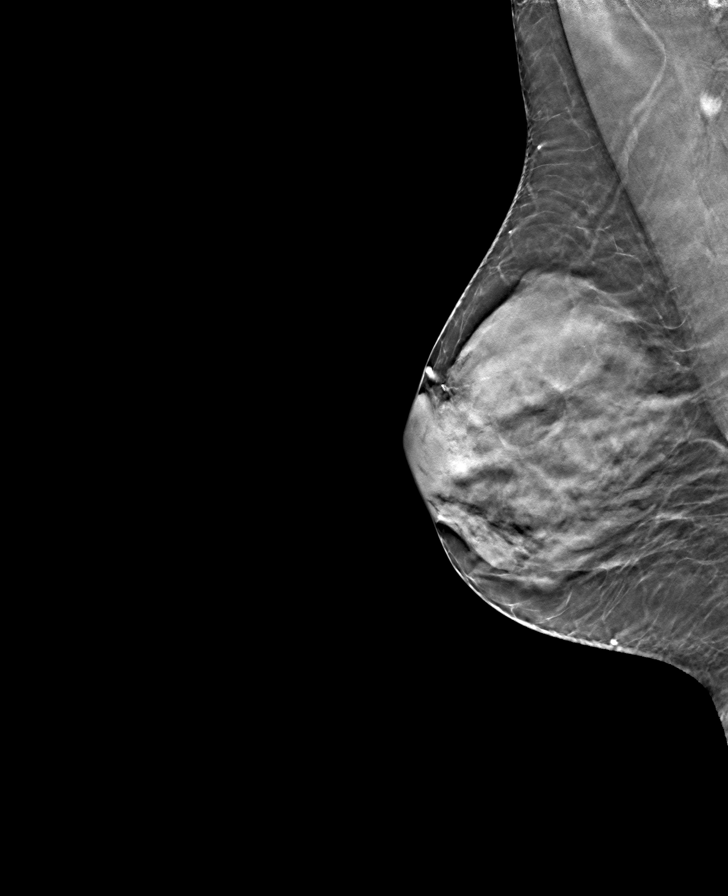

[R CC tomo · tomo slice 26/51.0]
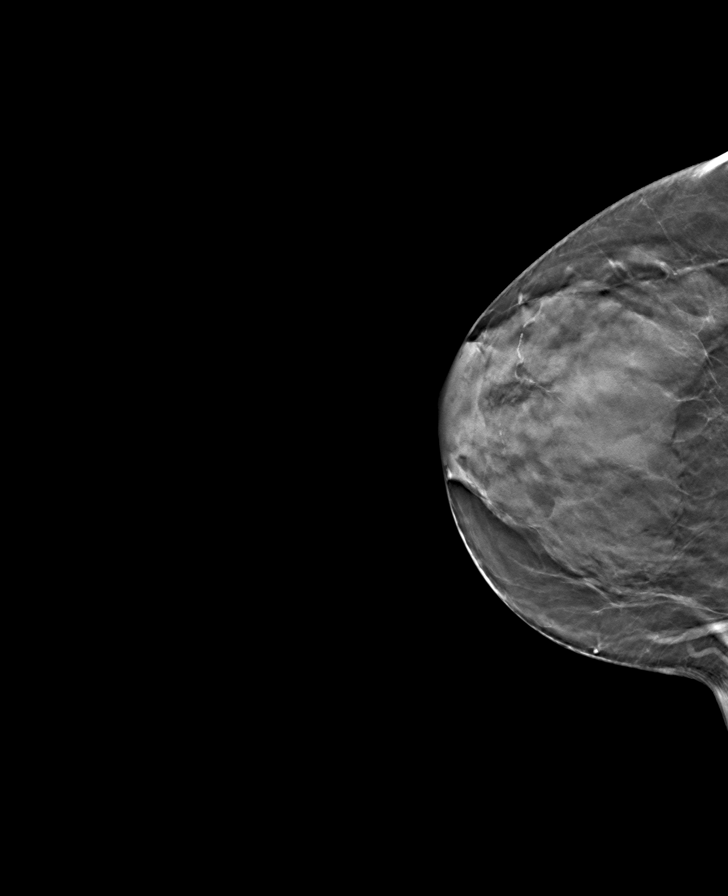

[L MLO tomo · tomo slice 24/47.0]
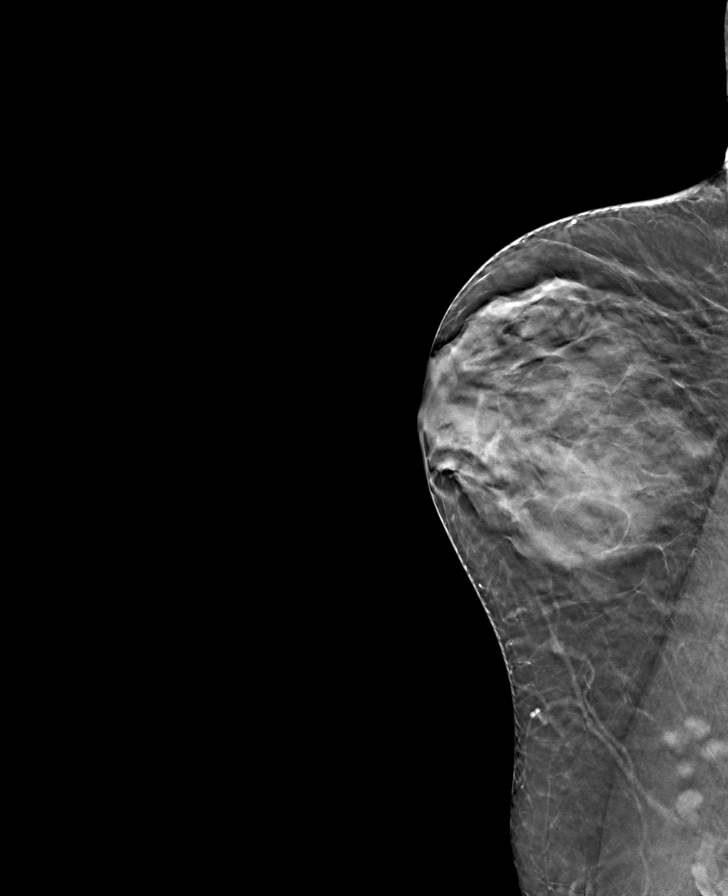

[8 of 24 positions shown; findings below may reference images not displayed]

ACR Breast Density Category d: The breast tissue is extremely dense,
which lowers the sensitivity of mammography
FINDINGS: There are no findings suspicious for malignancy. Images were
processed with CAD.
IMPRESSION: No mammographic evidence of malignancy. A result letter of this
screening mammogram will be mailed directly to the patient.

RECOMMENDATION:
Screening mammogram in one year. (Code:[5I])

BI-RADS CATEGORY  1: Negative.

## 2020-11-24 DIAGNOSIS — I10 Essential (primary) hypertension: Secondary | ICD-10-CM | POA: Insufficient documentation

## 2021-03-04 ENCOUNTER — Encounter: Payer: Self-pay | Admitting: Advanced Practice Midwife

## 2021-03-04 ENCOUNTER — Other Ambulatory Visit: Payer: Self-pay

## 2021-03-04 ENCOUNTER — Ambulatory Visit (INDEPENDENT_AMBULATORY_CARE_PROVIDER_SITE_OTHER): Payer: BC Managed Care – PPO | Admitting: Advanced Practice Midwife

## 2021-03-04 VITALS — BP 130/80 | Ht 60.0 in | Wt 116.0 lb

## 2021-03-04 DIAGNOSIS — Z0000 Encounter for general adult medical examination without abnormal findings: Secondary | ICD-10-CM | POA: Diagnosis not present

## 2021-03-04 DIAGNOSIS — Z13820 Encounter for screening for osteoporosis: Secondary | ICD-10-CM | POA: Diagnosis not present

## 2021-03-04 DIAGNOSIS — Z1239 Encounter for other screening for malignant neoplasm of breast: Secondary | ICD-10-CM | POA: Diagnosis not present

## 2021-03-04 NOTE — Progress Notes (Addendum)
Gynecology Annual Exam  PCP: Jordan Pink, MD  Chief Complaint:  Chief Complaint  Patient presents with   Annual Exam    History of Present Illness:Patient is a 65 y.o. G3P3003 presents for annual exam. The patient has no complaints today.   LMP: No LMP recorded. Patient is postmenopausal.  Postcoital Bleeding: no   The patient is sexually active. She denies dyspareunia.  The patient does perform self breast exams.  There is no notable family history of breast or ovarian cancer in her family.  The patient wears seatbelts: yes.   The patient has regular exercise: she walks regularly, she admits healthy lifestyle; diet, hydration, sleep.    The patient denies current symptoms of depression.     Review of Systems: Review of Systems  Constitutional:  Negative for chills and fever.  HENT:  Negative for congestion, ear discharge, ear pain, hearing loss, sinus pain and sore throat.   Eyes:  Negative for blurred vision and double vision.  Respiratory:  Negative for cough, shortness of breath and wheezing.   Cardiovascular:  Negative for chest pain, palpitations and leg swelling.  Gastrointestinal:  Negative for abdominal pain, blood in stool, constipation, diarrhea, heartburn, melena, nausea and vomiting.  Genitourinary:  Negative for dysuria, flank pain, frequency, hematuria and urgency.  Musculoskeletal:  Negative for back pain, joint pain and myalgias.  Skin:  Negative for itching and rash.  Neurological:  Negative for dizziness, tingling, tremors, sensory change, speech change, focal weakness, seizures, loss of consciousness, weakness and headaches.  Endo/Heme/Allergies:  Negative for environmental allergies. Does not bruise/bleed easily.  Psychiatric/Behavioral:  Negative for depression, hallucinations, memory loss, substance abuse and suicidal ideas. The patient is not nervous/anxious and does not have insomnia.    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      Past Surgical History:  Past Surgical History:  Procedure Laterality Date   BASAL CELL CARCINOMA EXCISION  10/2016   Moh's surgery   COLONOSCOPY  2010   CYSTOSCOPY  2015   ESOPHAGOGASTRODUODENOSCOPY  2011   LAPAROTOMY N/A 07/08/2019   Procedure: EXPLORATORY LAPAROTOMY possible bowel resection;  Surgeon: Jordan Maudlin, MD;  Location: [facility] ORS;  Service: General;  Laterality: N/A;   myringtomy     TUBAL LIGATION      Gynecologic History:  No LMP recorded. Patient is postmenopausal. Last Pap: 2 years ago Results were:  no abnormalities  Last mammogram: 1 year ago Results were: BI-RAD I  Obstetric History: [id number]  Family History:  Family History  Problem Relation Age of Onset   Diabetes type II Mother    Hypertension Mother    Cervical cancer Sister 52   Paranoid behavior Son    Breast cancer Neg Hx    Colon cancer Neg Hx    Ovarian cancer Neg Hx     Social History:  Social History   Socioeconomic History  Marital status: Married    Spouse name: Not on file   Number of children: 3   Years of education: Not on file   Highest education level: Not on file  Occupational History   Occupation: Microbiologist houses    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  Tobacco Use   Smoking status: Never   Smokeless tobacco: Never  Vaping Use   Vaping Use: Never used  Substance and Sexual  Activity   Alcohol use: No   Drug use: No   Sexual activity: Yes    Birth control/protection: Post-menopausal, Surgical  Other Topics Concern   Not on file  Social History Narrative   Not on file   Social Determinants of Health   Financial Resource Strain: Not on file  Food Insecurity: Not on file  Transportation Needs: Not on file  Physical Activity: Not on file  Stress: Not on file  Social Connections: Not on file  Intimate Partner Violence: Not on file    Allergies:  Allergies  Allergen Reactions   Prednisone     Make her feel nauseous and "funny in the head"    Medications: Prior to Admission medications   Medication Sig Start Date End Date Taking? Authorizing Provider  losartan (COZAAR) 25 MG tablet Take 1 tablet by mouth daily. 11/03/20 11/03/21 Yes [provider]  mirabegron ER (MYRBETRIQ) 50 MG TB24 tablet Take 50 mg by mouth. 06/01/16  Yes [provider]  mometasone (NASONEX) 50 MCG/ACT nasal spray SHAKE LIQUID WELL AND USE 2 SPRAYS IN EACH NOSTRIL EVERY DAY 04/13/16  Yes [provider]  polyethylene glycol (MIRALAX / GLYCOLAX) 17 g packet Take 17 g by mouth daily.   Yes [provider]  pravastatin (PRAVACHOL) 10 MG tablet Take 10 mg by mouth daily as needed. 12/29/20  Yes [provider]  ROCKLATAN 0.02-0.005 % SOLN INT 1 GTT IN OU QHS 11/29/18  Yes [provider]  traZODone (DESYREL) 50 MG tablet Take 50 mg by mouth.   Yes [provider]    Physical Exam Vitals: Blood pressure 130/80, height 5' (1.524 m), weight 116 lb (52.6 kg).  General: NAD HEENT: normocephalic, anicteric Thyroid: no enlargement, no palpable nodules Pulmonary: No increased work of breathing, CTAB Cardiovascular: RRR, distal pulses 2+ Breast: Breast symmetrical, no tenderness, no palpable nodules or masses, no skin or nipple retraction present, no nipple discharge.  No axillary or supraclavicular lymphadenopathy. Abdomen: NABS,  soft, non-tender, non-distended.  Umbilicus without lesions.  No hepatomegaly, splenomegaly or masses palpable. No evidence of hernia  Genitourinary:  External: Normal external female genitalia.  Normal urethral meatus, normal Bartholin's and Skene's glands.    Vagina: Normal vaginal mucosa, no evidence of prolapse.    Cervix: Grossly normal in appearance, no bleeding  Uterus: Non-enlarged, mobile, normal contour.  No CMT  Adnexa: ovaries non-enlarged, no adnexal masses  Rectal: deferred  Lymphatic: no evidence of inguinal lymphadenopathy Extremities: no edema, erythema, or tenderness Neurologic: Grossly intact Psychiatric: mood appropriate, affect full    Assessment: 65 y.o. G3P3003 routine annual exam  Plan: Problem List Items Addressed This Visit   None Visit Diagnoses     Well woman exam without gynecological exam    -  Primary   Relevant Orders   MM DIGITAL SCREENING BILATERAL   DG Bone Density   Breast screening       Relevant Orders   MM DIGITAL SCREENING BILATERAL   Screening for osteoporosis       Relevant Orders  DG Bone Density       1) Mammogram - recommend yearly screening mammogram.  Mammogram Was ordered today  2) STI screening  was not offered and therefore not obtained  3) ASCCP guidelines and rationale discussed.  Patient opts for every 5 years screening interval  4) Osteoporosis: screening ordered today - per USPTF routine screening DEXA at age 29  Consider FDA-approved medical therapies in postmenopausal women and men aged 45 years and older, based on the following: a) A hip or vertebral (clinical or morphometric) fracture b) T-score ? -2.5 at the femoral neck or spine after appropriate evaluation to exclude secondary causes C) Low bone mass (T-score between -1.0 and -2.5 at the femoral neck or spine) and a 10-year probability of a hip fracture ? 3% or a 10-year probability of a major osteoporosis-related fracture ? 20% based on the [place]-adapted WHO  algorithm   5) Routine healthcare maintenance including cholesterol, diabetes screening discussed managed by PCP  6) Colonoscopy is up to date.  Screening recommended starting at age 20 for average risk individuals, age 40 for individuals deemed at increased risk (including African Americans) and recommended to continue until age 80.  For patient age 52-85 individualized approach is recommended.  Gold standard screening is via colonoscopy, Cologuard screening is an acceptable alternative for patient unwilling or unable to undergo colonoscopy.  "Colorectal cancer screening for average?risk adults: 2018 guideline update from the American Cancer Society"CA: A Cancer Journal for Clinicians: Jan 19, 2017   7) Return in about 1 year (around 03/04/2022) for annual established gyn.    Christean Leaf, CNM [facility] 03/04/21, 8:59 AM

## 2021-03-04 NOTE — Patient Instructions (Addendum)
Preventing Cervical Cancer Cervical cancer is cancer that grows on the cervix. The cervix is at the bottom of the uterus. It connects the uterus to the vagina. The uterus is where a babydevelops during pregnancy. Cancer occurs when cells become abnormal and start to grow out of control. If cervical cancer is not found early, it can spread and become dangerous. Cervical cancer cannot always be prevented, but you can take steps to loweryour risk of developing this condition. How can this condition affect me? Cervical cancer grows slowly and may not cause any symptoms at first. Over time, the cancer can grow deep into the cervix tissue and spread to otherareas. This may take years, and it may happen without you knowing about it. If it is found early, cervical cancer can be treated effectively. If the cancer has grown deep into your cervix or has spread, it will be more difficult totreat. Most cases of cervical cancer are caused by an STI (sexually transmitted infection) called human papillomavirus (HPV). One way to reduce your risk of cervical cancer is to take steps to avoid infection with the HPV virus. Getting regular Pap tests is also important because this can help identify changes in cells that could lead to cancer. Your chances of getting this disease can also bereduced by making certain lifestyle changes. What can increase my risk? You are more likely to develop this condition if: You have certain things in your sexual history, such as: Having a sexually transmitted viral infection. These include chlamydia and herpes. Having more than one sexual partner, or having sex with someone who has more than one sexual partner. Not using condoms during sex. Having been sexually active before the age of 49. Your mother took a medicine called diethylstilbestrol (DES) while pregnant with you, causing you to be exposed to this medicine before birth. Your mother or sister has had cervical cancer. You are  between the ages of 76-65. You have or have had certain other medical conditions, such as: Previous cancer of the vagina or vulva. A weakened body defense system (immune system). A history of dysplasia of the cervix. You use oral contraceptives, also called birth control pills. You smoke or breathe in secondhand smoke. What actions can I take to prevent cervical cancer? Preventing HPV infection  Ask your health care provider about getting the HPV vaccine. If you are 65 years old or younger, you may need to get this vaccine, which is given in three doses over 6 months. This vaccine protects against the types of HPV that could cause cancer. Limit the number of people you have sex with. Also avoid having sex with people who have had many sex partners. Use a latex condom every time you have sex.  Getting Pap tests Get Pap tests regularly, starting at age 65. Talk with your health care provider about how often you need these tests. Having regular Pap tests will help identify changes in cells that could lead to cancer. Steps can then be taken to prevent cancer from developing. Most women who are 65?107 years years of age should have a Pap test every 3 years. Most women who are 65?65 years of age should have a Pap test in combination with an HPV test every 5 years. Women with a higher risk of cervical cancer, such as those with a weakened immune system or those who were exposed to DES medicine before birth, may need more frequent testing. Making other lifestyle changes  Do not use any products that contain nicotine  or tobacco, such as cigarettes, e-cigarettes, and chewing tobacco. If you need help quitting, ask your health care provider. Eat a healthy diet that includes at least 5 servings of fruits and vegetables every day. Lose weight if you are overweight.  Where to find support Talk with your health care provider, school nurse, or local health departmentfor guidance about screening and  vaccination. Some children and teens may be able to get the HPV vaccine free of charge through the [place] government's Vaccines for Children [facility] program. Other places that provide vaccinations include: Public health clinics. Check with your local health department. [place], where you would pay only what you can afford. To find one near you, check this website: http://lyons.com/ [place]. These are part of a program for Medicare and Medicaid patients who live in rural areas. The National Breast and Cervical Cancer Early Detection Program also provides breast and cervical cancer screenings and diagnostic services to low-income,uninsured, and underinsured women. Cervical cancer can be passed down through families. Talk with your health careprovider or a genetic counselor to learn more about genetic testing for cancer. Where to find more information Learn more about cervical cancer from: [employer]: www.acog.org American Cancer Society: www.cancer.org Centers for Disease Control and Prevention: http://www.wolf.info/ Contact a health care provider if you have: Pelvic pain. Unusual discharge or bleeding from your vagina. Summary Cervical cancer is cancer that grows on the cervix. The cervix is at the bottom of the uterus. Ask your health care provider about getting the HPV vaccine. Be sure to get regular Pap tests as recommended by your health care provider. See your health care provider right away if you have any pelvic pain or unusual discharge or bleeding from your vagina. This information is not intended to replace advice given to you by your health care provider. Make sure you discuss any questions you have with your healthcare provider. Document Revised: 03/12/2019 Document Reviewed: 03/12/2019 Elsevier Patient Education  [place] Maintenance After Age 17 After age 65, you are at a higher risk for certain long-term  diseases and infections as well as injuries from falls. Falls are a major cause of broken bones and head injuries in people who are older than age 65. Getting regular preventive care can help to keep you healthy and well. Preventive care includes getting regular testing and making lifestyle changes as recommended by your health care provider. Talk with your health care provider about: Which screenings and tests you should have. A screening is a test that checks for a disease when you have no symptoms. A diet and exercise plan that is right for you. What should I know about screenings and tests to prevent falls? Screening and testing are the best ways to find a health problem early. Early diagnosis and treatment give you the best chance of managing medical conditions that are common after age 34. Certain conditions and lifestyle choices may make you more likely to have a fall. Your health care provider may recommend: Regular vision checks. Poor vision and conditions such as cataracts can make you more likely to have a fall. If you wear glasses, make sure to get your prescription updated if your vision changes. Medicine review. Work with your health care provider to regularly review all of the medicines you are taking, including over-the-counter medicines. Ask your health care provider about any side effects that may make you more likely to have a fall. Tell your health care provider if any  medicines that you take make you feel dizzy or sleepy. Osteoporosis screening. Osteoporosis is a condition that causes the bones to get weaker. This can make the bones weak and cause them to break more easily. Blood pressure screening. Blood pressure changes and medicines to control blood pressure can make you feel dizzy. Strength and balance checks. Your health care provider may recommend certain tests to check your strength and balance while standing, walking, or changing positions. Foot health exam. Foot pain and  numbness, as well as not wearing proper footwear, can make you more likely to have a fall. Depression screening. You may be more likely to have a fall if you have a fear of falling, feel emotionally low, or feel unable to do activities that you used to do. Alcohol use screening. Using too much alcohol can affect your balance and may make you more likely to have a fall. What actions can I take to lower my risk of falls? General instructions Talk with your health care provider about your risks for falling. Tell your health care provider if: You fall. Be sure to tell your health care provider about all falls, even ones that seem minor. You feel dizzy, sleepy, or off-balance. Take over-the-counter and prescription medicines only as told by your health care provider. These include any supplements. Eat a healthy diet and maintain a healthy weight. A healthy diet includes low-fat dairy products, low-fat (lean) meats, and fiber from whole grains, beans, and lots of fruits and vegetables. Home safety Remove any tripping hazards, such as rugs, cords, and clutter. Install safety equipment such as grab bars in bathrooms and safety rails on stairs. Keep rooms and walkways well-lit. Activity  Follow a regular exercise program to stay fit. This will help you maintain your balance. Ask your health care provider what types of exercise are appropriate for you. If you need a cane or walker, use it as recommended by your health care provider. Wear supportive shoes that have nonskid soles.  Lifestyle Do not drink alcohol if your health care provider tells you not to drink. If you drink alcohol, limit how much you have: 0-1 drink a day for women. 0-2 drinks a day for men. Be aware of how much alcohol is in your drink. In the [place] one drink equals one typical bottle of beer (12 oz), one-half glass of wine (5 oz), or one shot of hard liquor (1 oz). Do not use any products that contain nicotine or tobacco, such  as cigarettes and e-cigarettes. If you need help quitting, ask your health care provider. Summary Having a healthy lifestyle and getting preventive care can help to protect your health and wellness after age 64. Screening and testing are the best way to find a health problem early and help you avoid having a fall. Early diagnosis and treatment give you the best chance for managing medical conditions that are more common for people who are older than age 16. Falls are a major cause of broken bones and head injuries in people who are older than age 33. Take precautions to prevent a fall at home. Work with your health care provider to learn what changes you can make to improve your health and wellness and to prevent falls. This information is not intended to replace advice given to you by your health care provider. Make sure you discuss any questions you have with your healthcare provider. Document Revised: 07/25/2020 Document Reviewed: 07/25/2020 Elsevier Patient Education  2022 [employer].

## 2021-03-05 ENCOUNTER — Other Ambulatory Visit: Payer: Self-pay | Admitting: Advanced Practice Midwife

## 2021-03-05 DIAGNOSIS — Z1231 Encounter for screening mammogram for malignant neoplasm of breast: Secondary | ICD-10-CM

## 2021-03-25 ENCOUNTER — Other Ambulatory Visit (HOSPITAL_COMMUNITY): Payer: Self-pay | Admitting: Otolaryngology

## 2021-03-25 ENCOUNTER — Other Ambulatory Visit: Payer: Self-pay | Admitting: Otolaryngology

## 2021-03-25 DIAGNOSIS — R42 Dizziness and giddiness: Secondary | ICD-10-CM

## 2021-04-02 ENCOUNTER — Other Ambulatory Visit: Payer: Self-pay | Admitting: Otolaryngology

## 2021-04-02 ENCOUNTER — Ambulatory Visit
Admission: RE | Admit: 2021-04-02 | Discharge: 2021-04-02 | Disposition: A | Discharge status: Home / Self Care | Payer: BC Managed Care – PPO | Source: Ambulatory Visit | Attending: Otolaryngology | Admitting: Otolaryngology

## 2021-04-02 ENCOUNTER — Other Ambulatory Visit: Payer: Self-pay

## 2021-04-02 DIAGNOSIS — R42 Dizziness and giddiness: Secondary | ICD-10-CM | POA: Diagnosis not present

## 2021-04-02 DIAGNOSIS — D330 Benign neoplasm of brain, supratentorial: Secondary | ICD-10-CM

## 2021-04-02 IMAGING — MR MR BRAIN/IAC WO/W CM
9 of 14 series · 25 of 48 positions shown · IV contrast (gadavist)
Comparison: No pertinent prior exams available for comparison.

CLINICAL DATA: Dizziness R42 ([[facility]]-CM). Additional history
provided by scanning technologist: Patient reports vertigo for 1
year, worsening.

EXAM:
MRI HEAD WITHOUT AND WITH CONTRAST
TECHNIQUE: Multiplanar, multiecho pulse sequences of the brain and surrounding
structures were obtained without and with intravenous contrast.
CONTRAST:  5mL GADAVIST GADOBUTROL 1 MMOL/ML IV SOLN

[Series 5: T1 · sagittal · 5.0mm · 0.62mm/px · 3 of 21 slices shown (1 of 3)]
[im 1/21]
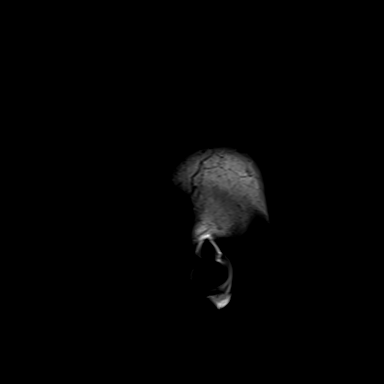
[im 11/21]
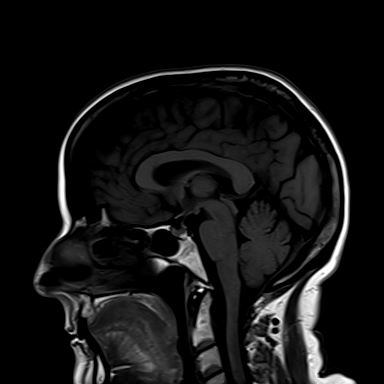
[im 21/21]
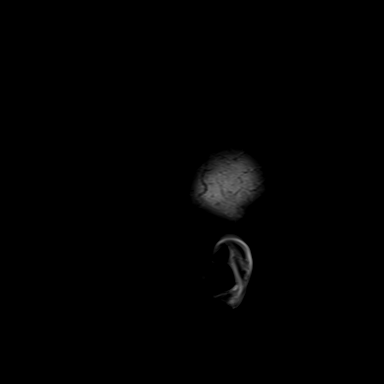

[Series 6: ax dwi_tracew · axial · 3.0mm · 0.60mm/px · z∈[-85,+69]mm · 3 of 48 slices shown]
[im 1/48]
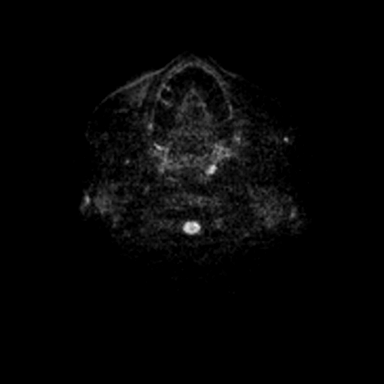
[im 24/48]
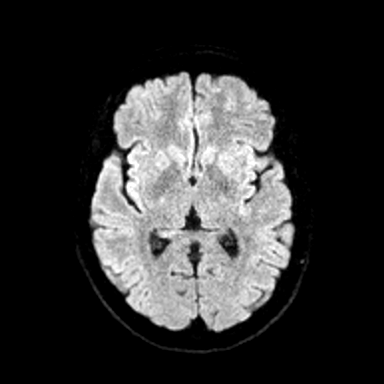
[im 48/48]
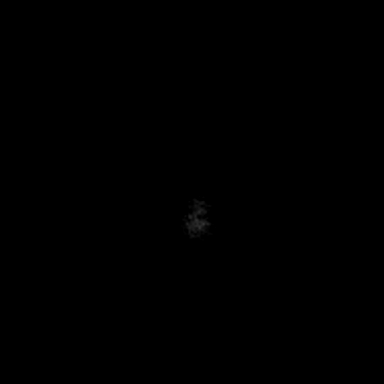

[Series 8: T2 · axial · 5.0mm · 0.53mm/px · z∈[-79,+63]mm · 2 of 25 slices shown]
[im 1/25]
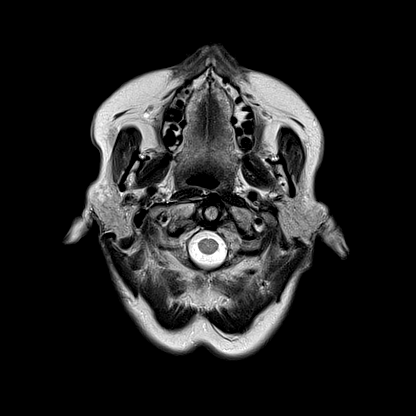
[im 25/25]
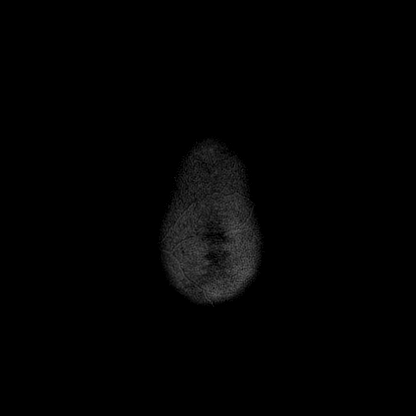

[Series 13: FLAIR · axial · 3.0mm · 0.53mm/px · z∈[-88,+72]mm · 4 of 55 slices shown]
[im 1/55]
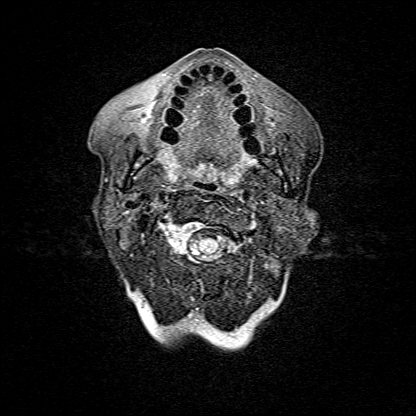
[im 19/55]
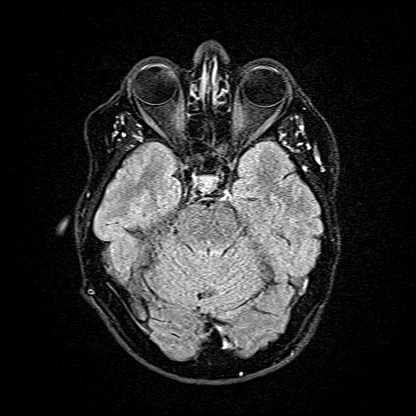
[im 37/55]
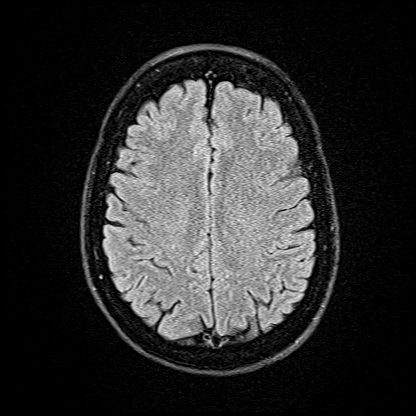
[im 55/55]
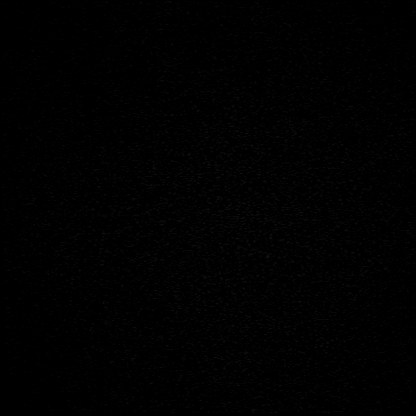

[Series 14: T1 · coronal · non-contrast · 3.0mm · 0.21mm/px · 1 of 13 slices shown (2 of 3)]
[im 1/13]
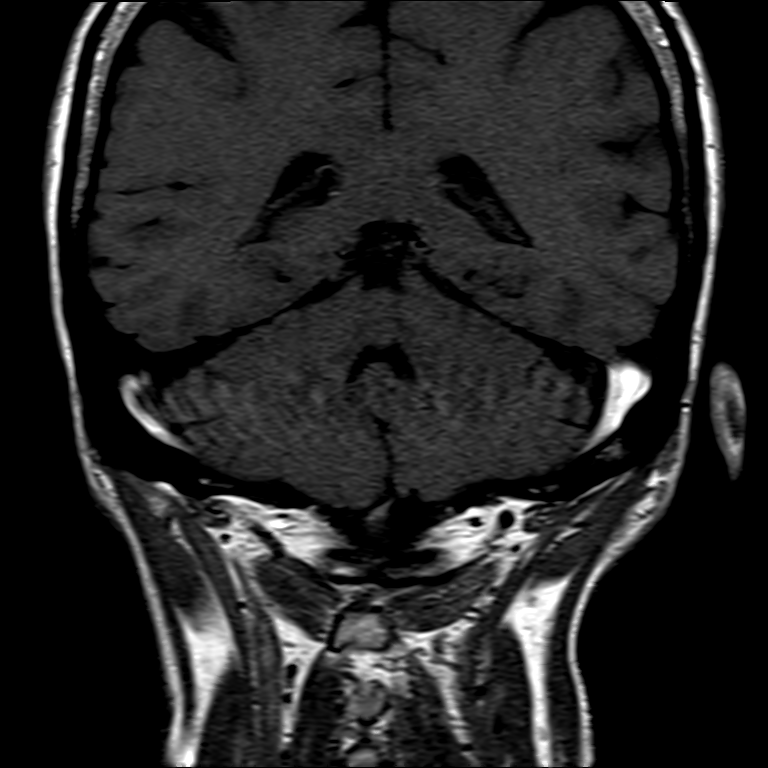

[Series 16: T1 · axial · non-contrast · 3.0mm · 0.21mm/px · 1 of 15 slices shown (3 of 3)]
[im 1/15]
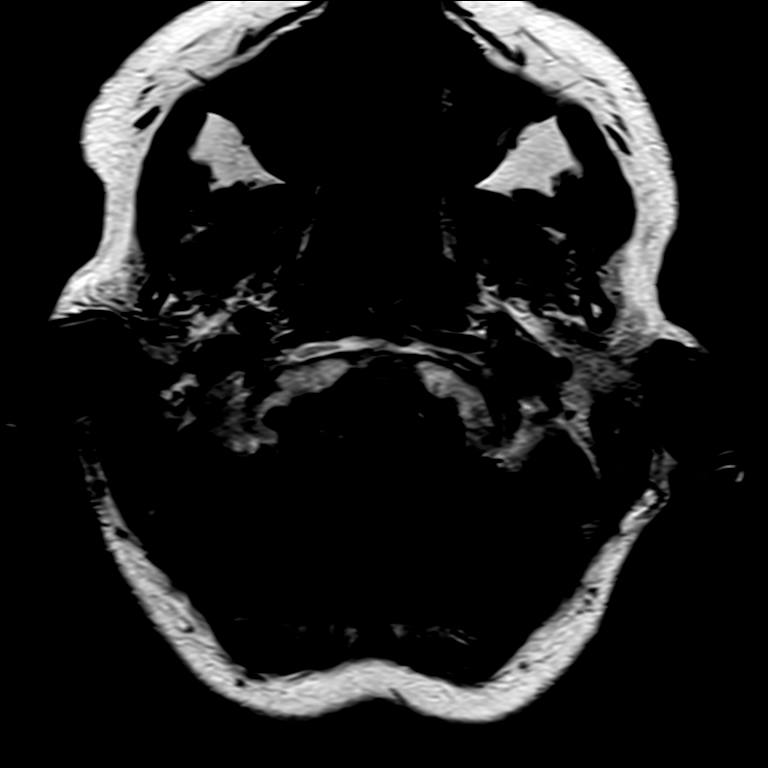

[Series 17: T1 post-contrast · axial · 3.0mm · 0.21mm/px · 1 of 15 slices shown (1 of 3)]
[im 1/15]
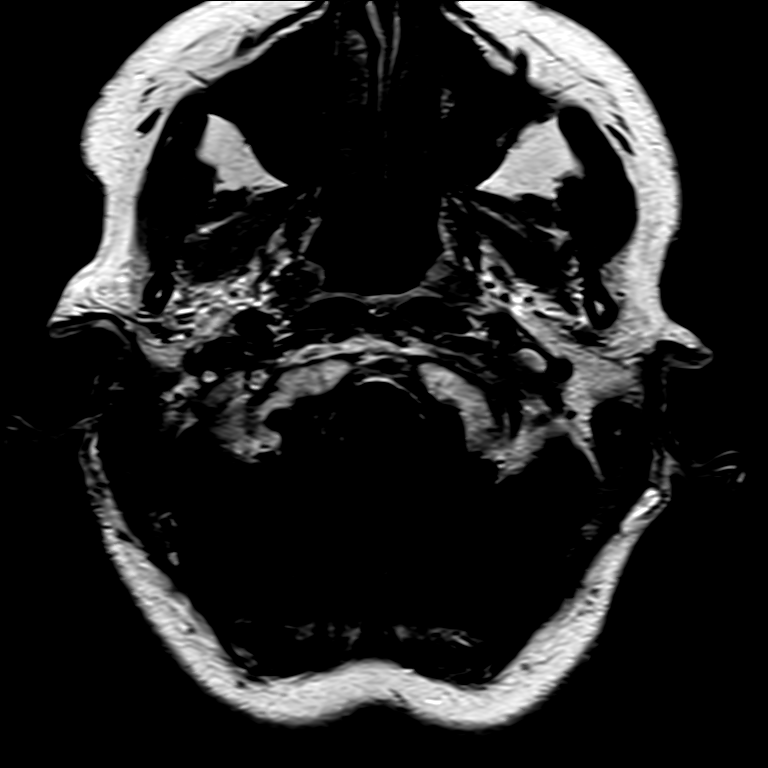

[Series 18: T1 post-contrast · coronal · 3.0mm · 0.21mm/px · 1 of 13 slices shown (2 of 3)]
[im 1/13]
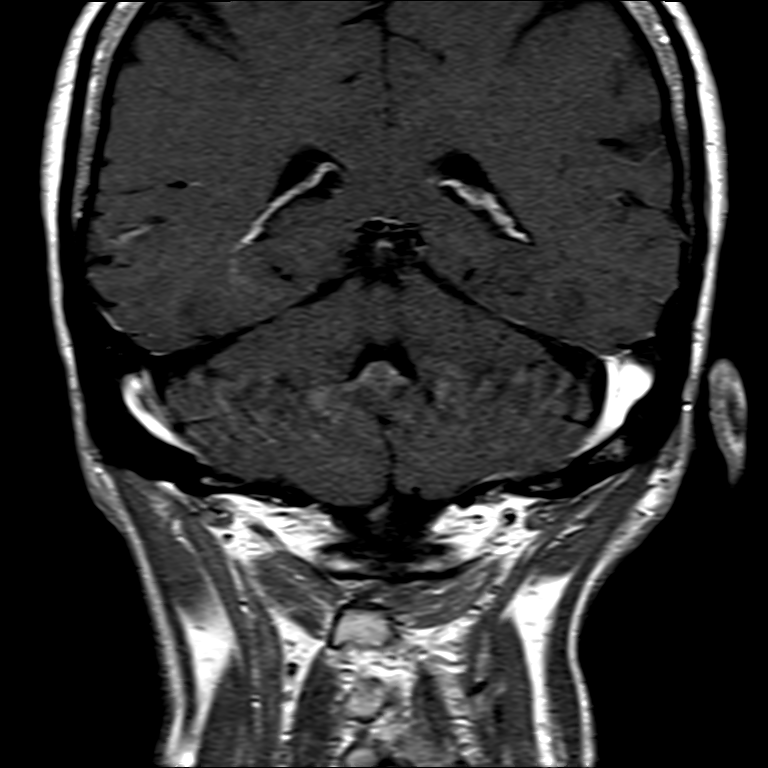

[Series 19: T1 post-contrast · axial · 1.0mm · 0.98mm/px · z∈[-93,+80]mm · 9 of 176 slices shown (3 of 3)]
[im 1/176]
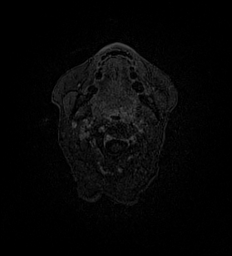
[im 30/176]
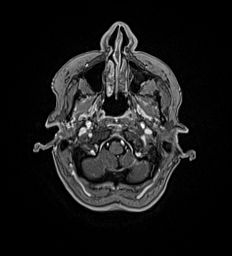
[im 59/176]
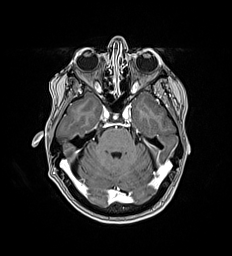
[im 73/176]
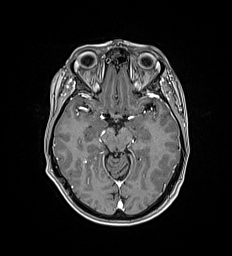
[im 88/176]
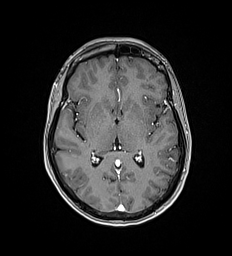
[im 103/176]
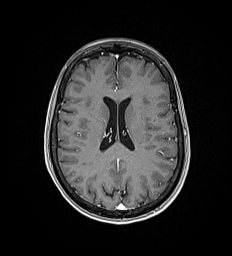
[im 117/176]
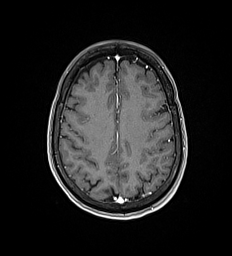
[im 146/176]
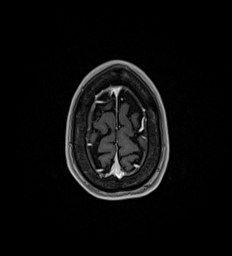
[im 176/176]
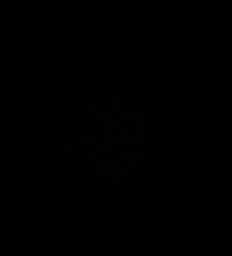

[25 of 48 positions shown; findings below may reference images not displayed]

FINDINGS: Brain:

Cerebral volume is normal.

No cortical encephalomalacia is identified.

There is lobular T2 hyperintense signal abnormality along the
lateral margin of the body of the left lateral ventricle, measuring
1.4 x 0.7 cm in transaxial dimensions, with subtle surrounding
T2/FLAIR hyperintense signal (for instance as seen on series 13,
image 34) (series 8, image 16). There is no corresponding abnormal
enhancement at this site.

No evidence of a cerebellopontine angle or internal auditory canal
mass. Unremarkable appearance of the seventh and eighth cranial
nerves bilaterally.

There is no acute infarct.

No chronic intracranial blood products.

No extra-axial fluid collection.

No midline shift.

No pathologic intracranial enhancement is identified.

Vascular: Maintained flow voids within the proximal large arterial
vessels. Right temporal occipital lobe developmental venous anomaly
(an incidental anatomic variant).

Skull and upper cervical spine: No focal suspicious marrow lesion.

Sinuses/Orbits: Visualized orbits show no acute finding. 2.3 cm
mucous retention cyst within the left maxillary sinus. Trace
bilateral ethmoid sinus mucosal thickening.

Other: Trace fluid within the left mastoid air cells.

Impression #1 will be called to the ordering clinician or
representative by the Radiologist Assistant, and communication
documented in the PACS or [REDACTED].
IMPRESSION: Nonenhancing 1.4 x 0.7 cm lobular focus of signal abnormality along
the lateral margin of the body of the left lateral ventricle, as
described. This finding may be developmental or may reflect a remote
insult. However, a low-grade primary CNS neoplasm cannot be excluded
and initial short-interval 4-6 month contrast-enhanced MRI follow-up
is recommended for surveillance.

Otherwise unremarkable MRI appearance of the brain for age.

No cerebellopontine angle or internal auditory canal mass.

Paranasal sinus disease, as described.

Trace left mastoid effusion.

## 2021-04-02 MED ORDER — GADOBUTROL 1 MMOL/ML IV SOLN
5.0000 mL | Freq: Once | INTRAVENOUS | Status: AC | PRN
  Administered 2021-04-02: 5 mL via INTRAVENOUS

## 2021-06-08 ENCOUNTER — Encounter: Payer: Self-pay | Admitting: General Surgery

## 2021-06-16 ENCOUNTER — Ambulatory Visit
Admission: RE | Admit: 2021-06-16 | Discharge: 2021-06-16 | Disposition: A | Discharge status: Home / Self Care | Payer: BC Managed Care – PPO | Source: Ambulatory Visit | Attending: Advanced Practice Midwife | Admitting: Advanced Practice Midwife

## 2021-06-16 ENCOUNTER — Other Ambulatory Visit: Payer: Self-pay

## 2021-06-16 DIAGNOSIS — Z1231 Encounter for screening mammogram for malignant neoplasm of breast: Secondary | ICD-10-CM | POA: Insufficient documentation

## 2021-06-16 IMAGING — MG MM DIGITAL SCREENING BILAT W/ TOMO AND CAD
8 series · 9 of 24 positions shown · non-contrast
Comparison: Previous exam(s).

CLINICAL DATA: Screening.

EXAM:
DIGITAL SCREENING BILATERAL MAMMOGRAM WITH TOMOSYNTHESIS AND CAD
TECHNIQUE: Bilateral screening digital craniocaudal and mediolateral oblique
mammograms were obtained. Bilateral screening digital breast
tomosynthesis was performed. The images were evaluated with
computer-aided detection.

[R MLO synth-2D]
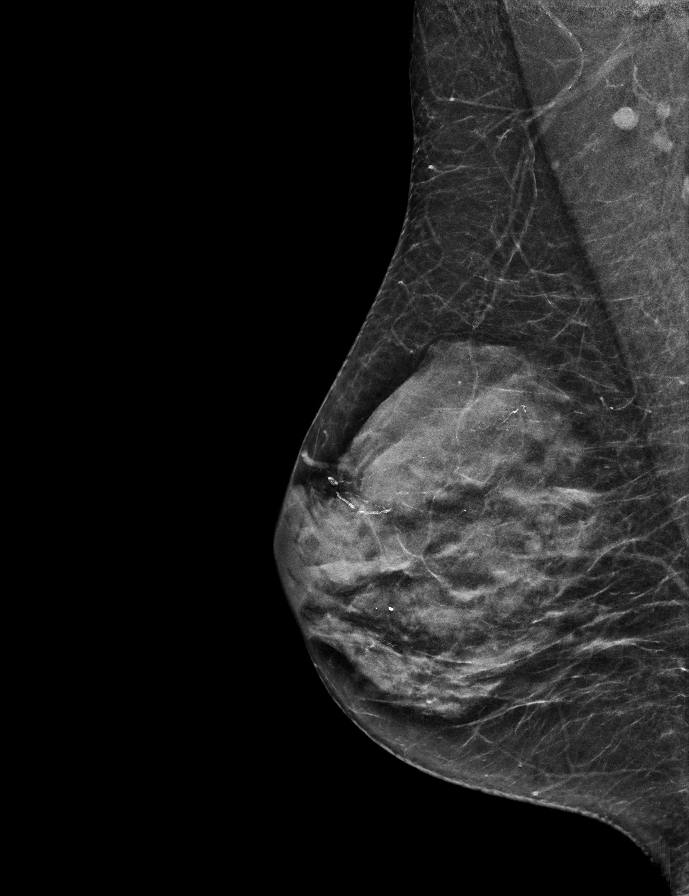

[L MLO synth-2D]
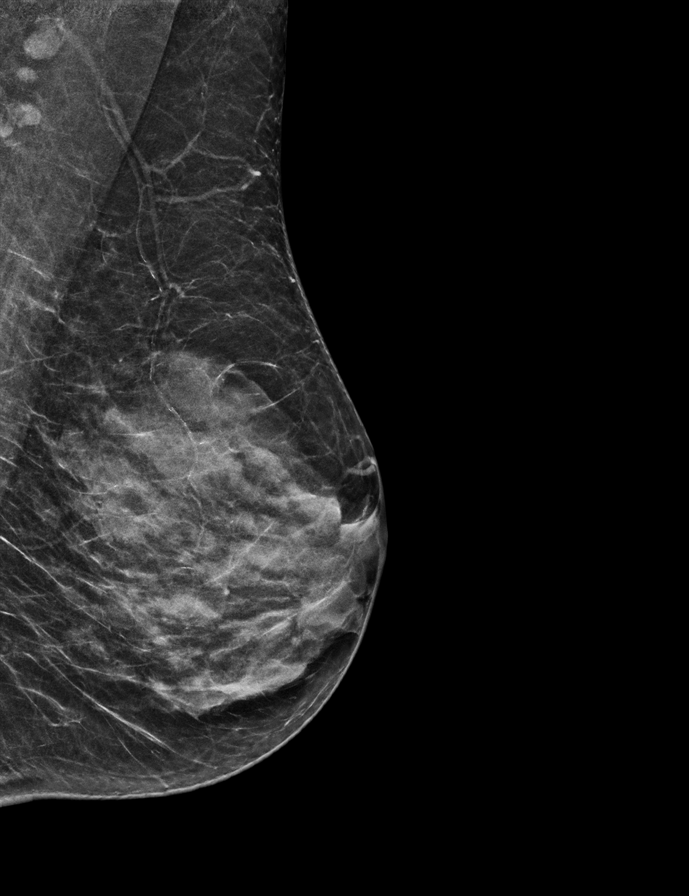

[L CC synth-2D]
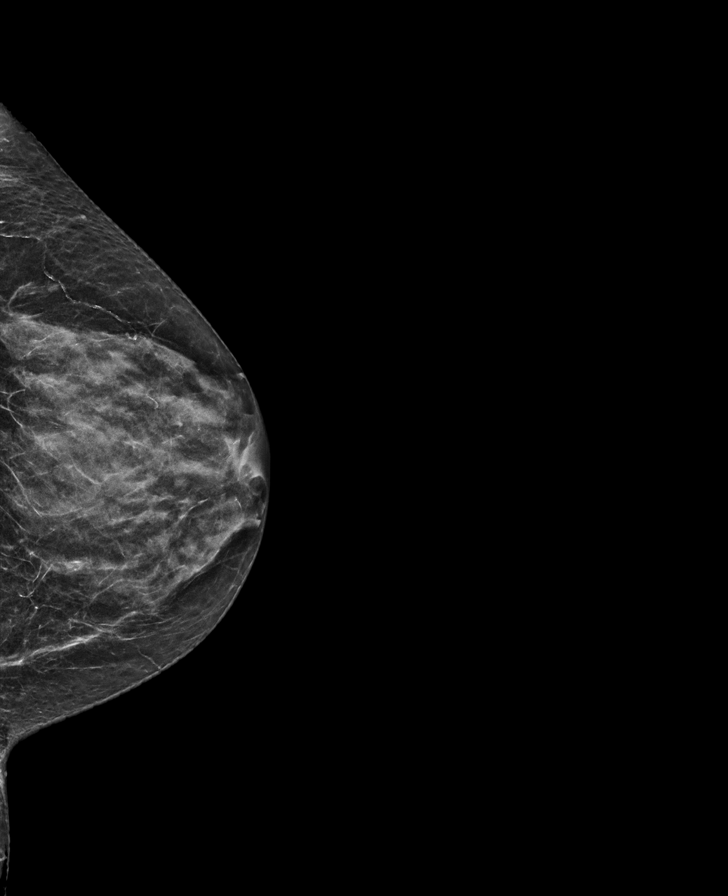

[R CC synth-2D]
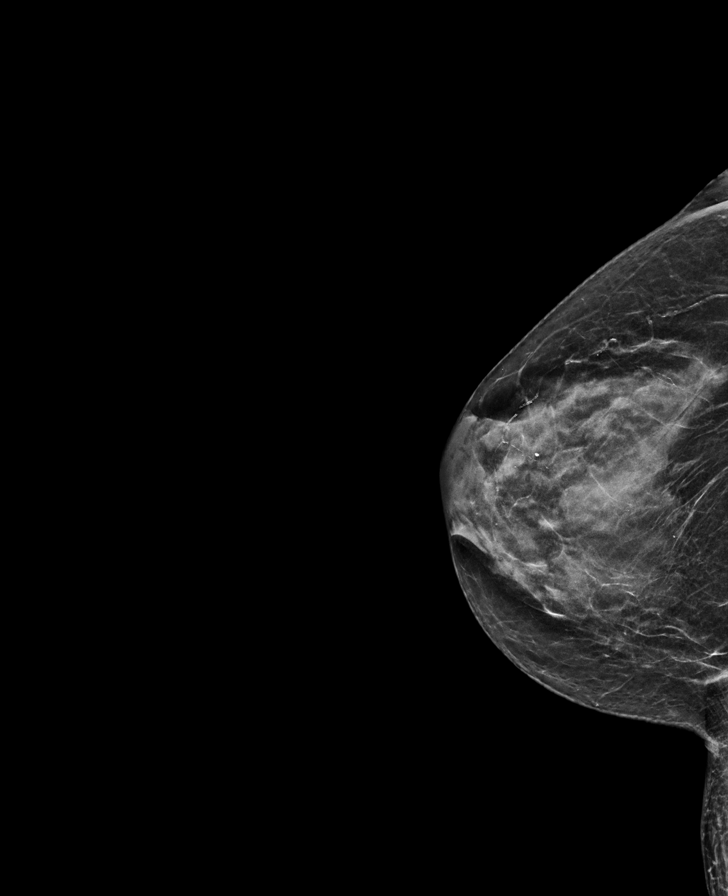

[L CC tomo · 2 of 51 frames shown]
[frame 17/51]
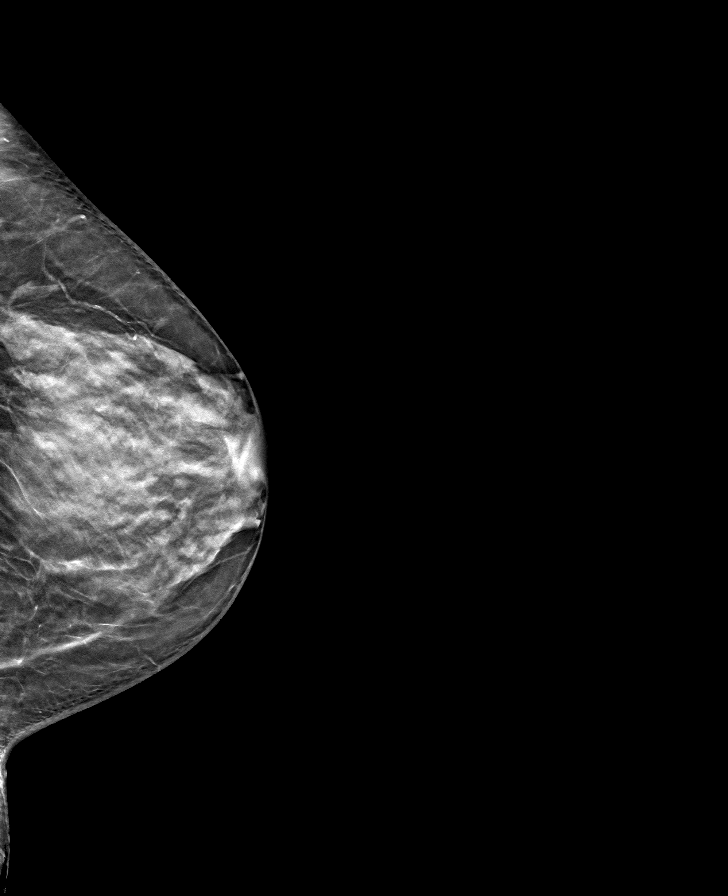
[frame 26/51]
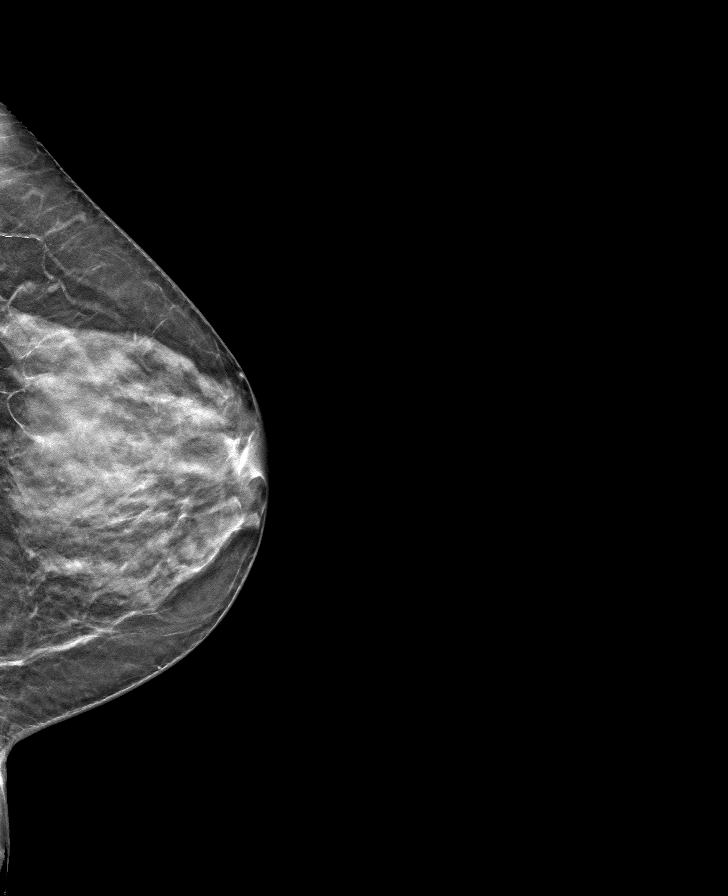

[L MLO tomo · tomo slice 23/44.0]
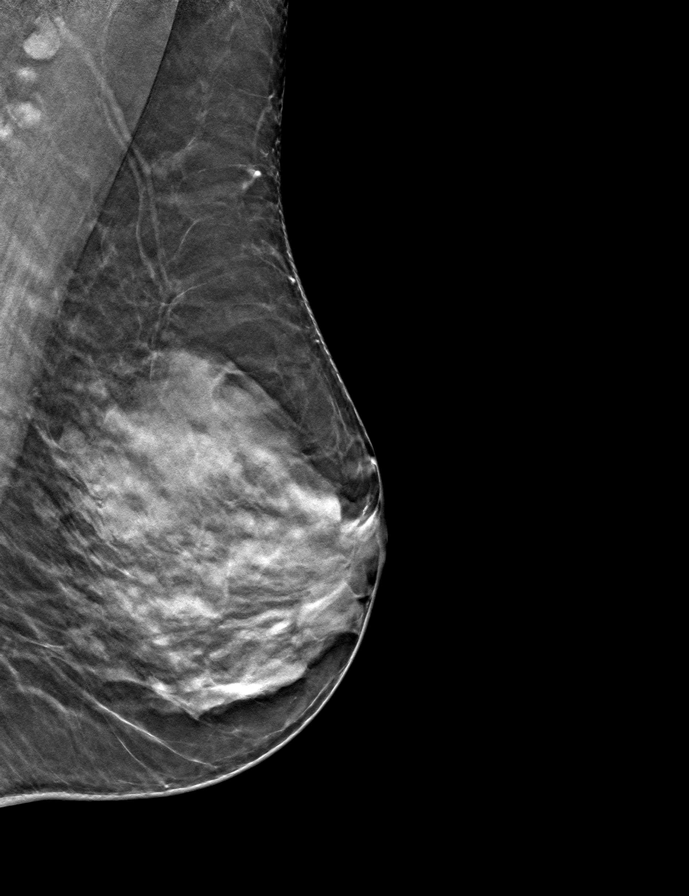

[R CC tomo · tomo slice 27/53.0]
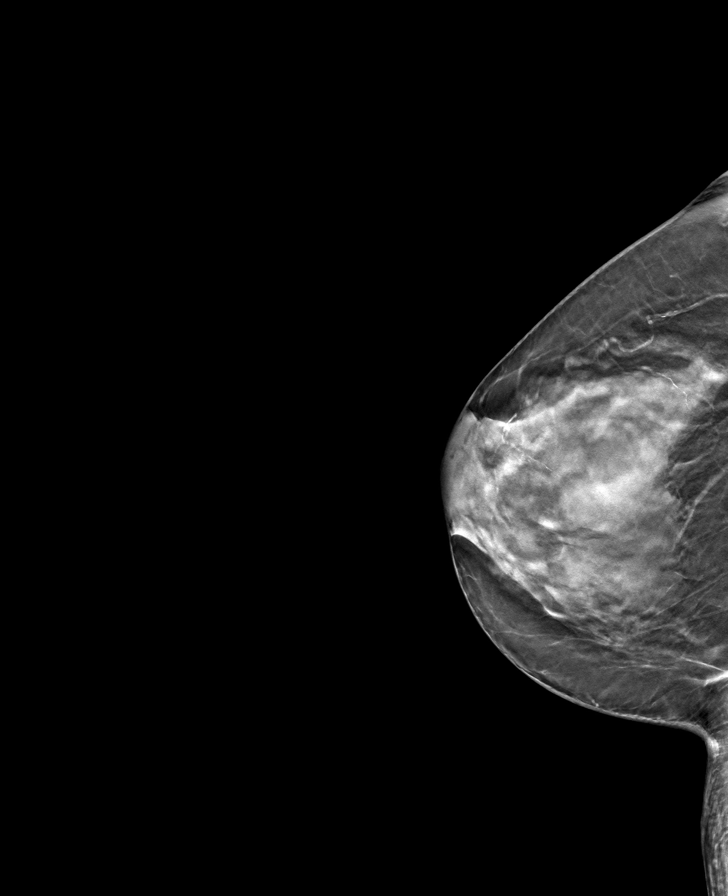

[R MLO tomo · tomo slice 26/51.0]
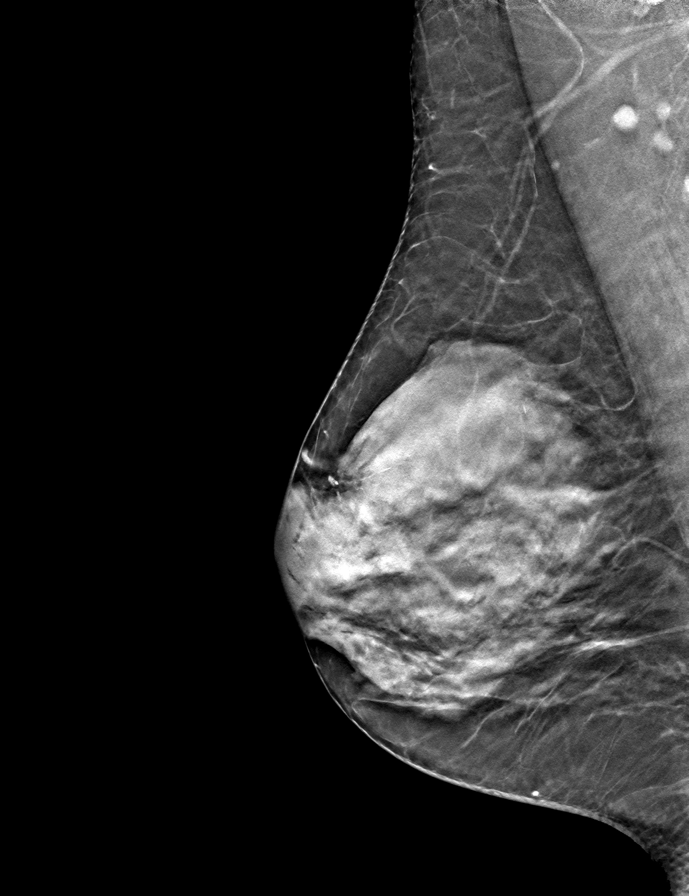

[9 of 24 positions shown; findings below may reference images not displayed]

ACR Breast Density Category c: The breast tissue is heterogeneously
dense, which may obscure small masses.
FINDINGS: There are no findings suspicious for malignancy.
IMPRESSION: No mammographic evidence of malignancy. A result letter of this
screening mammogram will be mailed directly to the patient.

RECOMMENDATION:
Screening mammogram in one year. (Code:[V2])

BI-RADS CATEGORY  1: Negative.

## 2021-08-03 ENCOUNTER — Other Ambulatory Visit: Payer: Self-pay

## 2021-08-03 ENCOUNTER — Ambulatory Visit
Admission: RE | Admit: 2021-08-03 | Discharge: 2021-08-03 | Disposition: A | Discharge status: Home / Self Care | Payer: BC Managed Care – PPO | Source: Ambulatory Visit | Attending: Otolaryngology | Admitting: Otolaryngology

## 2021-08-03 DIAGNOSIS — D330 Benign neoplasm of brain, supratentorial: Secondary | ICD-10-CM | POA: Insufficient documentation

## 2021-08-03 IMAGING — MR MR BRAIN/TEMPORAL BONE/IAC
11 of 16 series · 30 of 48 positions shown · IV contrast (gadavist)
Comparison: Head MRI [DATE]

CLINICAL DATA: Benign neoplasm of brain, supratentorial. Follow-up
abnormal MRI.

EXAM:
MRI HEAD WITHOUT AND WITH CONTRAST
TECHNIQUE: Multiplanar, multiecho pulse sequences of the brain and surrounding
structures were obtained without and with intravenous contrast.
CONTRAST:  5mL GADAVIST GADOBUTROL 1 MMOL/ML IV SOLN

[Series 5: T1 · sagittal · 5.0mm · 0.62mm/px · 1 of 25 slices shown (1 of 4)]
[im 1/25]
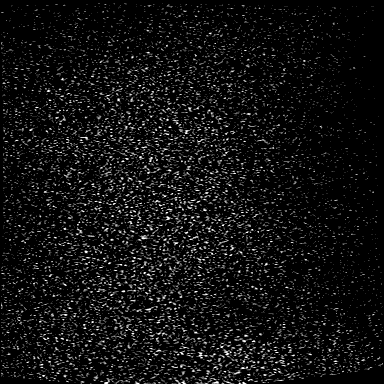

[Series 6: ax dwi_tracew · axial · 3.0mm · 0.60mm/px · z∈[-94,+58]mm · 2 of 48 slices shown]
[im 1/48]
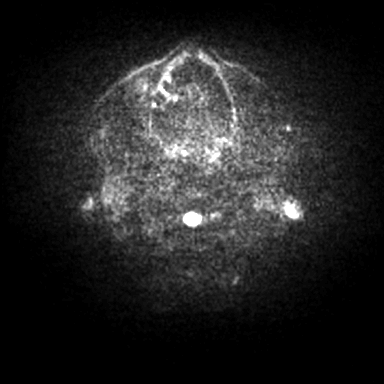
[im 48/48]
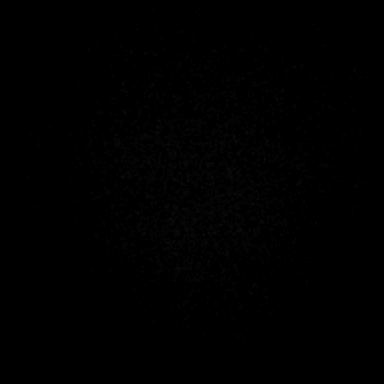

[Series 7: ax dwi_adc · axial · 3.0mm · 0.60mm/px · z∈[-94,+55]mm · 3 of 47 slices shown]
[im 1/47]
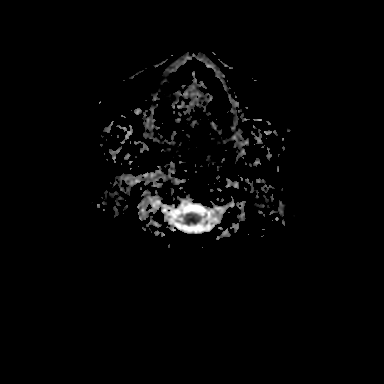
[im 24/47]
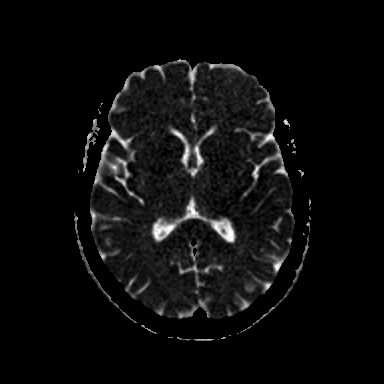
[im 47/47]
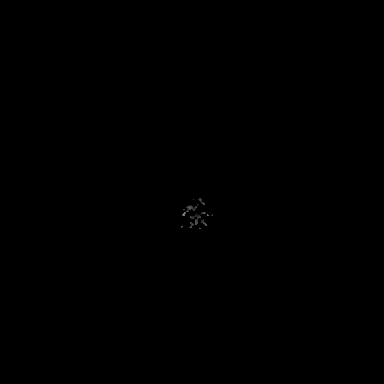

[Series 8: T2 · axial · 5.0mm · 0.53mm/px · 1 of 25 slices shown]
[im 1/25]
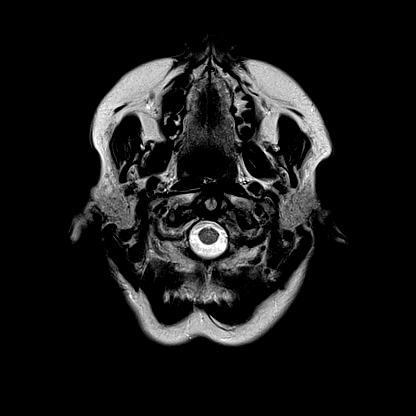

[Series 13: FLAIR · axial · 3.0mm · 0.53mm/px · z∈[-98,+61]mm · 3 of 55 slices shown]
[im 1/55]
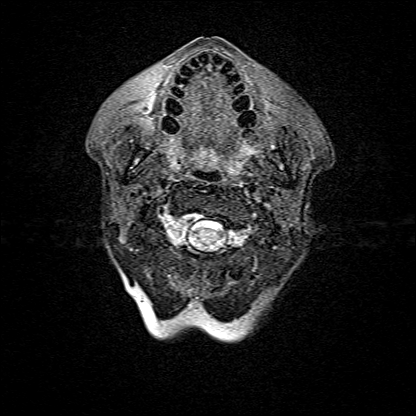
[im 28/55]
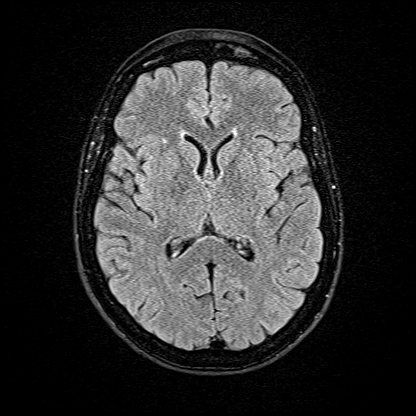
[im 55/55]
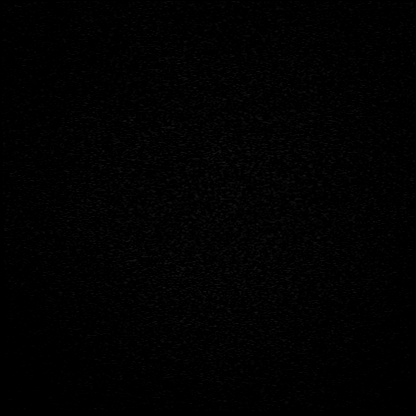

[Series 15: T1 · axial · 1.0mm · 0.98mm/px · z∈[-87,+67]mm · 8 of 160 slices shown (2 of 4)]
[im 1/160]
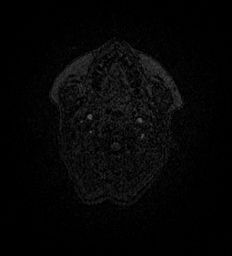
[im 20/160]
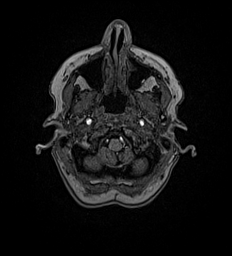
[im 40/160]
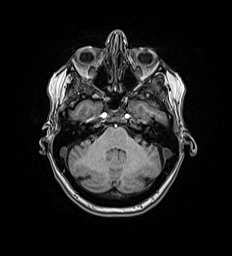
[im 60/160]
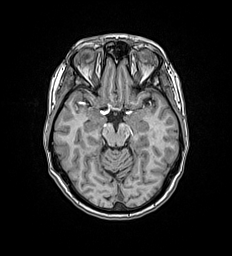
[im 100/160]
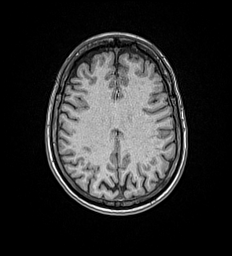
[im 120/160]
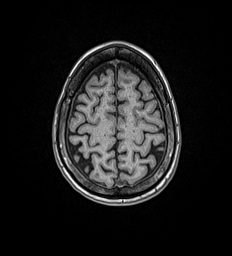
[im 140/160]
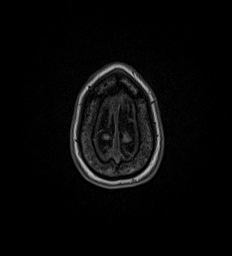
[im 160/160]
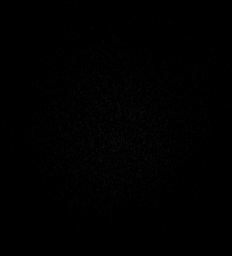

[Series 16: T1 · axial · non-contrast · 3.0mm · 0.21mm/px · 1 of 15 slices shown (3 of 4)]
[im 1/15]
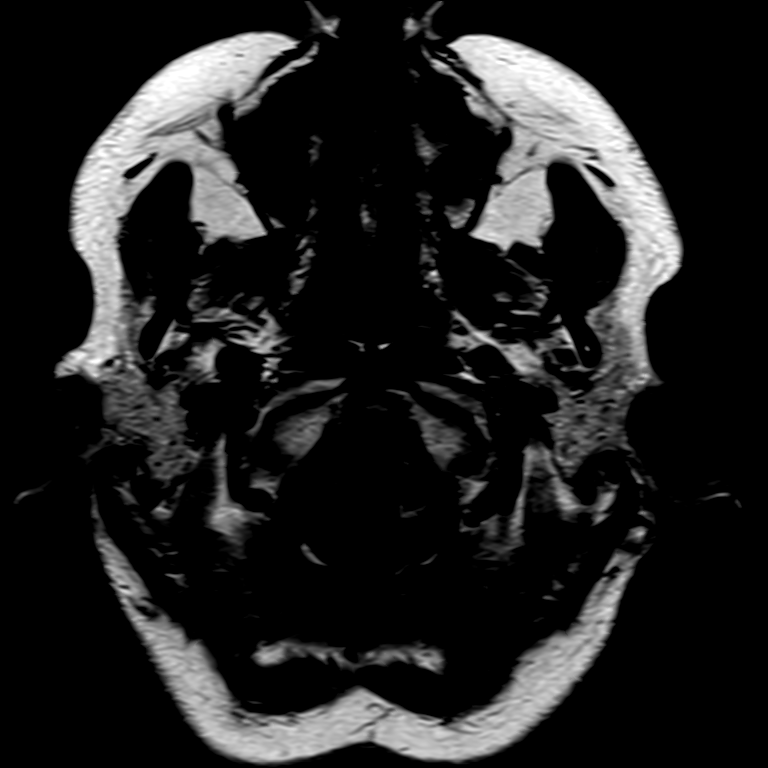

[Series 17: T1 · coronal · non-contrast · 3.0mm · 0.21mm/px · 1 of 13 slices shown (4 of 4)]
[im 1/13]
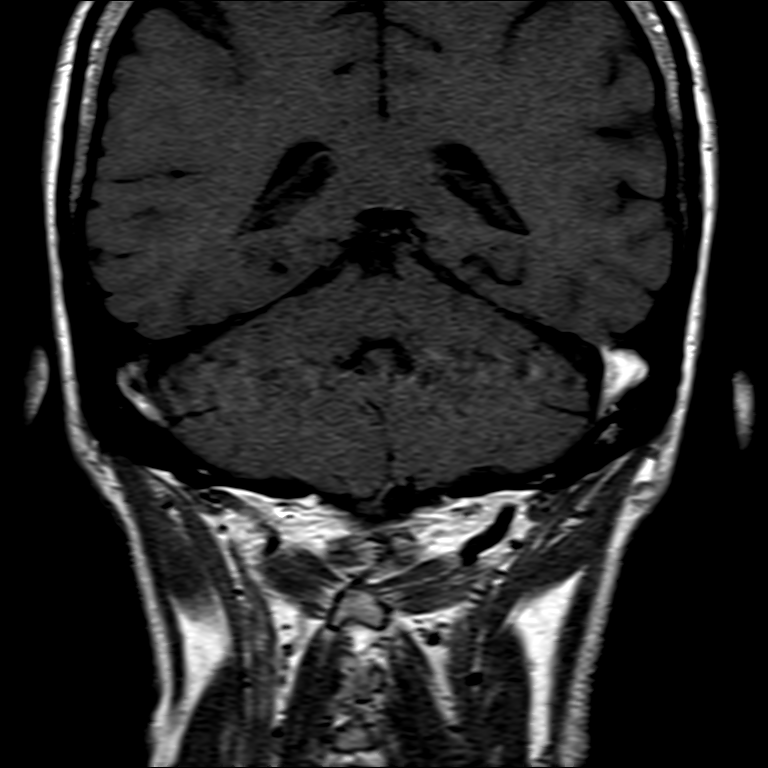

[Series 18: T1 post-contrast · axial · 3.0mm · 0.21mm/px · 1 of 15 slices shown (1 of 3)]
[im 1/15]
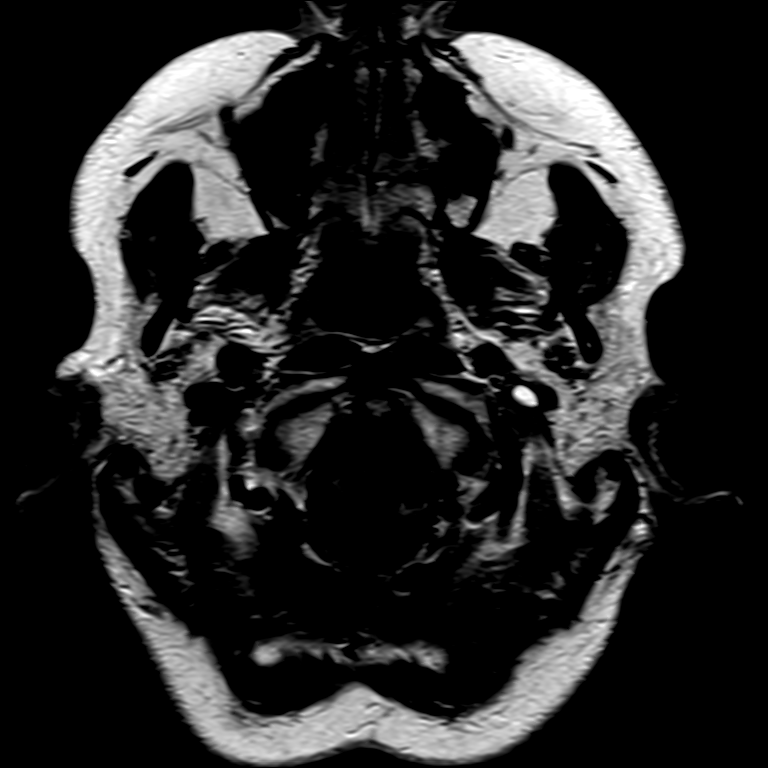

[Series 19: T1 post-contrast · coronal · 3.0mm · 0.21mm/px · 1 of 13 slices shown (2 of 3)]
[im 1/13]
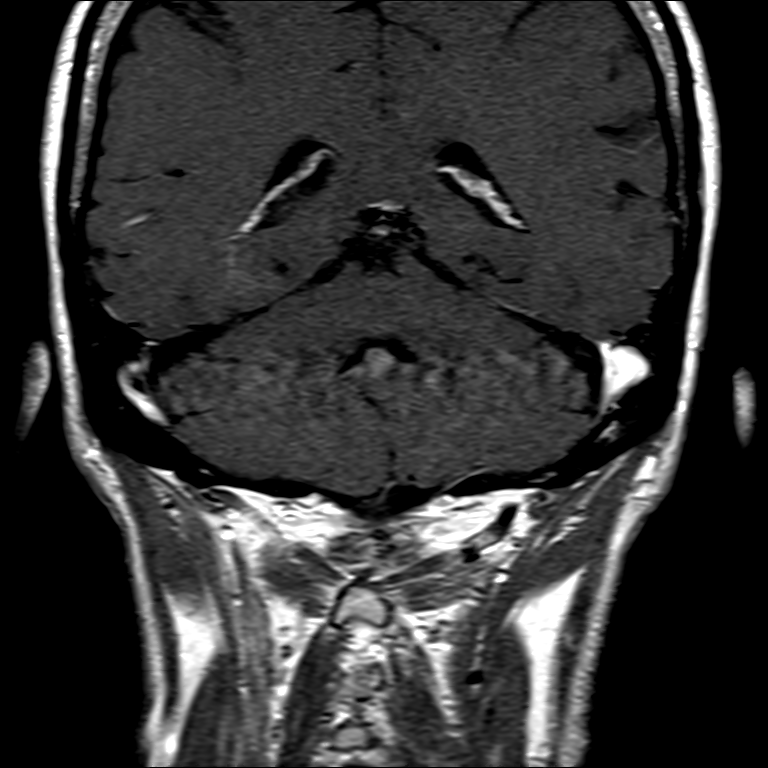

[Series 20: T1 post-contrast · axial · 1.0mm · 0.98mm/px · z∈[-102,+70]mm · 8 of 176 slices shown (3 of 3)]
[im 1/176]
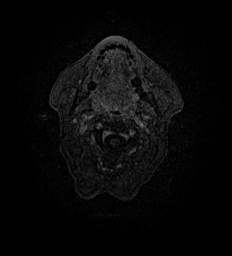
[im 20/176]
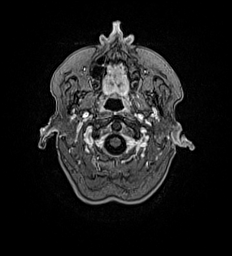
[im 59/176]
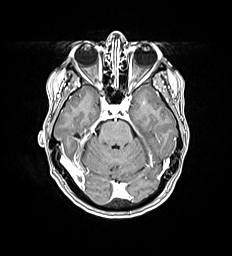
[im 78/176]
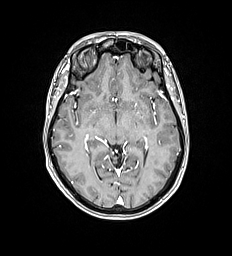
[im 98/176]
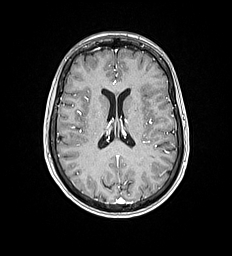
[im 117/176]
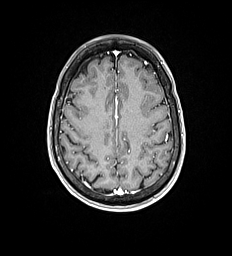
[im 156/176]
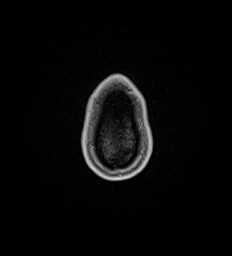
[im 176/176]
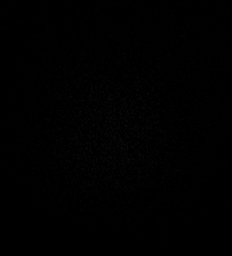

[30 of 48 positions shown; findings below may reference images not displayed]

FINDINGS: Brain: A 14 x 7 mm focus of signal abnormality along the lateral
margin of the body of the left lateral ventricle is unchanged. This
is again noted to be T1 hypointense and T2 hyperintense and
partially suppresses on FLAIR imaging with the appearance
potentially indicating multiple small adjacent cystic spaces. A thin
rim of peripheral FLAIR hyperintensity is unchanged, and there
remains no associated enhancement.

The brain is normal in signal elsewhere. There is no evidence of an
acute infarct, intracranial hemorrhage, midline shift, or
extra-axial fluid collection. The ventricles and sulci are normal in
size.

Dedicated imaging through the internal auditory canals demonstrates
a normal course of cranial nerves VII and VIII without evidence of a
mass or abnormal enhancement. The inner ear structures demonstrate
normal signal and morphology bilaterally. No mass is present in the
cerebellopontine angles.

Vascular: Major intracranial vascular flow voids are preserved.

Skull and upper cervical spine: Unremarkable bone marrow signal.

Sinuses/Orbits: Unremarkable orbits. Mucous retention cyst in the
left maxillary sinus. No significant mastoid fluid.

Other: None.
IMPRESSION: 1. Unchanged 14 mm focus of signal abnormality along the left
lateral ventricle. While a benign etiology is favored, this remains
indeterminate and a low-grade neoplasm is not excluded. An
additional MRI is recommended in 1 year to ensure continued
stability.
2. Negative internal auditory canal imaging.

## 2021-08-03 MED ORDER — GADOBUTROL 1 MMOL/ML IV SOLN
5.0000 mL | Freq: Once | INTRAVENOUS | Status: AC | PRN
  Administered 2021-08-03: 5 mL via INTRAVENOUS

## 2021-08-06 ENCOUNTER — Other Ambulatory Visit: Payer: Self-pay | Admitting: Otolaryngology

## 2021-08-06 DIAGNOSIS — D330 Benign neoplasm of brain, supratentorial: Secondary | ICD-10-CM

## 2021-12-25 DIAGNOSIS — L821 Other seborrheic keratosis: Secondary | ICD-10-CM | POA: Insufficient documentation

## 2021-12-25 DIAGNOSIS — Z85828 Personal history of other malignant neoplasm of skin: Secondary | ICD-10-CM | POA: Insufficient documentation

## 2021-12-25 DIAGNOSIS — D225 Melanocytic nevi of trunk: Secondary | ICD-10-CM | POA: Insufficient documentation

## 2022-03-05 ENCOUNTER — Ambulatory Visit: Payer: BC Managed Care – PPO | Admitting: Advanced Practice Midwife

## 2022-03-12 ENCOUNTER — Ambulatory Visit (INDEPENDENT_AMBULATORY_CARE_PROVIDER_SITE_OTHER): Payer: BC Managed Care – PPO | Admitting: Advanced Practice Midwife

## 2022-03-12 ENCOUNTER — Encounter: Payer: Self-pay | Admitting: Advanced Practice Midwife

## 2022-03-12 VITALS — BP 120/80 | Ht 60.0 in | Wt 121.0 lb

## 2022-03-12 DIAGNOSIS — Z1239 Encounter for other screening for malignant neoplasm of breast: Secondary | ICD-10-CM | POA: Diagnosis not present

## 2022-03-12 NOTE — Patient Instructions (Signed)
Insomnia Insomnia is a sleep disorder that makes it difficult to fall asleep or stay asleep. Insomnia can cause fatigue, low energy, difficulty concentrating, mood swings, and poor performance at work or school. There are three different ways to classify insomnia: Difficulty falling asleep. Difficulty staying asleep. Waking up too early in the morning. Any type of insomnia can be long-term (chronic) or short-term (acute). Both are common. Short-term insomnia usually lasts for 3 months or less. Chronic insomnia occurs at least three times a week for longer than 3 months. What are the causes? Insomnia may be caused by another condition, situation, or substance, such as: Having certain mental health conditions, such as anxiety and depression. Using caffeine, alcohol, tobacco, or drugs. Having gastrointestinal conditions, such as gastroesophageal reflux disease (GERD). Having certain medical conditions. These include: Asthma. Alzheimer's disease. Stroke. Chronic pain. An overactive thyroid gland (hyperthyroidism). Other sleep disorders, such as restless legs syndrome and sleep apnea. Menopause. Sometimes, the cause of insomnia may not be known. What increases the risk? Risk factors for insomnia include: Gender. Females are affected more often than males. Age. Insomnia is more common as people get older. Stress and certain medical and mental health conditions. Lack of exercise. Having an irregular work schedule. This may include working night shifts and traveling between different time zones. What are the signs or symptoms? If you have insomnia, the main symptom is having trouble falling asleep or having trouble staying asleep. This may lead to other symptoms, such as: Feeling tired or having low energy. Feeling nervous about going to sleep. Not feeling rested in the morning. Having trouble concentrating. Feeling irritable, anxious, or depressed. How is this diagnosed? This condition  may be diagnosed based on: Your symptoms and medical history. Your health care provider may ask about: Your sleep habits. Any medical conditions you have. Your mental health. A physical exam. How is this treated? Treatment for insomnia depends on the cause. Treatment may focus on treating an underlying condition that is causing the insomnia. Treatment may also include: Medicines to help you sleep. Counseling or therapy. Lifestyle adjustments to help you sleep better. Follow these instructions at home: Eating and drinking  Limit or avoid alcohol, caffeinated beverages, and products that contain nicotine and tobacco, especially close to bedtime. These can disrupt your sleep. Do not eat a large meal or eat spicy foods right before bedtime. This can lead to digestive discomfort that can make it hard for you to sleep. Sleep habits  Keep a sleep diary to help you and your health care provider figure out what could be causing your insomnia. Write down: When you sleep. When you wake up during the night. How well you sleep and how rested you feel the next day. Any side effects of medicines you are taking. What you eat and drink. Make your bedroom a dark, comfortable place where it is easy to fall asleep. Put up shades or blackout curtains to block light from outside. Use a white noise machine to block noise. Keep the temperature cool. Limit screen use before bedtime. This includes: Not watching TV. Not using your smartphone, tablet, or computer. Stick to a routine that includes going to bed and waking up at the same times every day and night. This can help you fall asleep faster. Consider making a quiet activity, such as reading, part of your nighttime routine. Try to avoid taking naps during the day so that you sleep better at night. Get out of bed if you are still awake after   15 minutes of trying to sleep. Keep the lights down, but try reading or doing a quiet activity. When you feel  sleepy, go back to bed. General instructions Take over-the-counter and prescription medicines only as told by your health care provider. Exercise regularly as told by your health care provider. However, avoid exercising in the hours right before bedtime. Use relaxation techniques to manage stress. Ask your health care provider to suggest some techniques that may work well for you. These may include: Breathing exercises. Routines to release muscle tension. Visualizing peaceful scenes. Make sure that you drive carefully. Do not drive if you feel very sleepy. Keep all follow-up visits. This is important. Contact a health care provider if: You are tired throughout the day. You have trouble in your daily routine due to sleepiness. You continue to have sleep problems, or your sleep problems get worse. Get help right away if: You have thoughts about hurting yourself or someone else. Get help right away if you feel like you may hurt yourself or others, or have thoughts about taking your own life. Go to your nearest emergency room or: Call 911. Call the National Suicide Prevention Lifeline at 1-800-273-8255 or 988. This is open 24 hours a day. Text the Crisis Text Line at 741741. Summary Insomnia is a sleep disorder that makes it difficult to fall asleep or stay asleep. Insomnia can be long-term (chronic) or short-term (acute). Treatment for insomnia depends on the cause. Treatment may focus on treating an underlying condition that is causing the insomnia. Keep a sleep diary to help you and your health care provider figure out what could be causing your insomnia. This information is not intended to replace advice given to you by your health care provider. Make sure you discuss any questions you have with your health care provider. Document Revised: 07/20/2021 Document Reviewed: 07/20/2021 Elsevier Patient Education  2023 Elsevier Inc.  

## 2022-03-12 NOTE — Progress Notes (Signed)
Gynecology Annual Exam  PCP: Maryland Pink, MD  Chief Complaint:  Chief Complaint  Patient presents with   Annual Gyn/Breast Exam    History of Present Illness:Patient is a 66 y.o. G3P3003 presents for annual exam. The patient has no gyn complaints today. She mentions shoulder/scapula area muscle pain which she thinks may be due to sleep positioning with CPAP.  LMP: No LMP recorded. Patient is postmenopausal.  Postcoital Bleeding: no  The patient is occasionally sexually active. She denies dyspareunia.  The patient does perform self breast exams.  There is no notable family history of breast or ovarian cancer in her family.  The patient wears seatbelts: yes.   The patient has regular exercise: she has an active lifestyle; walking, gardening. She admits generally good diet- weakness is sweets, she admits adequate hydration. She uses CPAP during sleep.    The patient denies current symptoms of depression.     Review of Systems: Review of Systems  Constitutional:  Negative for chills and fever.  HENT:  Negative for congestion, ear discharge, ear pain, hearing loss, sinus pain and sore throat.   Eyes:  Negative for blurred vision and double vision.  Respiratory:  Negative for cough, shortness of breath and wheezing.   Cardiovascular:  Negative for chest pain, palpitations and leg swelling.  Gastrointestinal:  Negative for abdominal pain, blood in stool, constipation, diarrhea, heartburn, melena, nausea and vomiting.  Genitourinary:  Negative for dysuria, flank pain, frequency, hematuria and urgency.  Musculoskeletal:  Positive for back pain. Negative for joint pain and myalgias.  Skin:  Negative for itching and rash.  Neurological:  Negative for dizziness, tingling, tremors, sensory change, speech change, focal weakness, seizures, loss of consciousness, weakness and headaches.  Endo/Heme/Allergies:  Negative for environmental allergies. Does not bruise/bleed easily.   Psychiatric/Behavioral:  Negative for depression, hallucinations, memory loss, substance abuse and suicidal ideas. The patient is not nervous/anxious and does not have insomnia.     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     Past Surgical History:  Past Surgical History:  Procedure Laterality Date   BASAL CELL CARCINOMA EXCISION  10/2016   Moh's surgery   COLONOSCOPY  2010   CYSTOSCOPY  2015   ESOPHAGOGASTRODUODENOSCOPY  2011   LAPAROTOMY N/A 07/08/2019   Procedure: EXPLORATORY LAPAROTOMY possible bowel resection;  Surgeon: Fredirick Maudlin, MD;  Location: [facility] ORS;  Service: General;  Laterality: N/A;   myringtomy     TUBAL LIGATION      Gynecologic History:  No LMP recorded. Patient is postmenopausal. Last Pap: 2020 Results were:  no abnormalities  Last mammogram: 06/16/21 Results were: BI-RAD I  Obstetric History: [id number]  Family History:  Family History  Problem Relation Age of Onset   Diabetes type II Mother    Hypertension Mother  Cervical cancer Sister 43   Paranoid behavior Son    Breast cancer Neg Hx    Colon cancer Neg Hx    Ovarian cancer Neg Hx     Social History:  Social History   Socioeconomic History   Marital status: Married    Spouse name: Not on file    Number of children: 3   Years of education: Not on file   Highest education level: Not on file  Occupational History   Occupation: Microbiologist houses    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  Tobacco Use   Smoking status: Never   Smokeless tobacco: Never  Vaping Use   Vaping Use: Never used  Substance and Sexual Activity   Alcohol use: No   Drug use: No   Sexual activity: Yes    Birth control/protection: Post-menopausal, Surgical  Other Topics Concern   Not on file  Social History Narrative   Not on file   Social Determinants of Health   Financial Resource Strain: Not on file  Food Insecurity: Not on file  Transportation Needs: Not on file  Physical Activity: Sufficiently Active (02/19/2019)   Exercise Vital Sign    Days of Exercise per Week: 4 days    Minutes of Exercise per Session: 40 min  Stress: Not on file  Social Connections: Not on file  Intimate Partner Violence: Not on file    Allergies:  Allergies  Allergen Reactions   Prednisone     Make her feel nauseous and "funny in the head"    Medications: Prior to Admission medications   Medication Sig Start Date End Date Taking? Authorizing Provider  azelastine (ASTELIN) 0.1 % nasal spray USE 2 SPRAYS IN EACH NOSTRIL EVERY 12 HOURS AS NEEDED FOR DRAINAGE 03/25/21  Yes [provider]  hydrochlorothiazide (HYDRODIURIL) 12.5 MG tablet Take by mouth. 05/08/21  Yes [provider]  ipratropium (ATROVENT) 0.03 % nasal spray USE 1 TO 2 SPRAYS IN EACH NOSTRIL THREE TIMES DAILY AS NEEDED FOR RUNNY NOSE 06/09/21  Yes [provider]  mirabegron ER (MYRBETRIQ) 50 MG TB24 tablet Take 50 mg by mouth. 06/01/16  Yes [provider]  mometasone (NASONEX) 50 MCG/ACT nasal spray SHAKE LIQUID WELL AND USE 2 SPRAYS IN EACH NOSTRIL EVERY DAY 04/13/16  Yes [provider]  polyethylene glycol (MIRALAX / GLYCOLAX) 17 g packet Take 17 g by mouth daily.   Yes [provider]  pravastatin  (PRAVACHOL) 10 MG tablet Take 10 mg by mouth daily as needed. 12/29/20  Yes [provider]  ROCKLATAN 0.02-0.005 % SOLN INT 1 GTT IN OU QHS 11/29/18  Yes [provider]  SUMAtriptan (IMITREX) 50 MG tablet  08/28/21  Yes [provider]  fexofenadine (ALLEGRA ALLERGY) 180 MG tablet     [provider]  losartan (COZAAR) 25 MG tablet Take 1 tablet by mouth daily. 11/03/20 11/03/21  [provider]  Magnesium 200 MG TABS     [provider]    Physical Exam Vitals: Blood pressure 120/80, height 5' (1.524 m), weight 121 lb (54.9 kg).  General: NAD HEENT: normocephalic, anicteric Thyroid: no enlargement, no palpable nodules Pulmonary: No increased work of breathing, CTAB Cardiovascular: RRR, distal pulses 2+ Breast: Breast symmetrical, no tenderness, no palpable nodules or masses, no skin or nipple retraction present, no nipple discharge.  No axillary or supraclavicular lymphadenopathy. Abdomen: NABS, soft, non-tender, non-distended.  Umbilicus without lesions.  No hepatomegaly, splenomegaly or masses palpable. No evidence of hernia  Genitourinary: deferred for no concerns/PAP interval Extremities: no edema, erythema, or tenderness Neurologic: Grossly intact Psychiatric: mood appropriate, affect full    Assessment: 66 y.o. G3P3003 annual breast exam  Plan: Problem List Items Addressed This Visit   None Visit Diagnoses     Encounter for screening breast examination    -  Primary   Relevant Orders   MM 3D SCREEN BREAST BILATERAL       1) Mammogram - recommend yearly screening mammogram.  Mammogram Was ordered today  2) STI screening  was offered and declined  3) ASCCP guidelines and rationale discussed.  Patient opts for every 5 years screening interval  4) Osteoporosis: last screening Osteopenia 09/22/21 - per USPTF routine screening DEXA at age 24 - FRAX 81 year major fracture risk 10,  10 year hip fracture risk 1.4  Consider  FDA-approved medical therapies in postmenopausal women and men aged 70 years and older, based on the following: a) A hip or vertebral (clinical or morphometric) fracture b) T-score ? -2.5 at the femoral neck or spine after appropriate evaluation to exclude secondary causes C) Low bone mass (T-score between -1.0 and -2.5 at the femoral neck or spine) and a 10-year probability of a hip fracture ? 3% or a 10-year probability of a major osteoporosis-related fracture ? 20% based on the [place]-adapted WHO algorithm   5) Routine healthcare maintenance including cholesterol, diabetes screening discussed managed by PCP  6) Colonoscopy: last screening normal in 2020.  Screening recommended starting at age 98 for average risk individuals, age 37 for individuals deemed at increased risk (including African Americans) and recommended to continue until age 27.  For patient age 26-85 individualized approach is recommended.  Gold standard screening is via colonoscopy, Cologuard screening is an acceptable alternative for patient unwilling or unable to undergo colonoscopy.  "Colorectal cancer screening for average?risk adults: 2018 guideline update from the American Cancer Society"CA: A Cancer Journal for Clinicians: Jan 19, 2017   7) Return in about 1 year (around 03/13/2023) for annual breast screening.   Christean Leaf, CNM [facility] 03/12/22, 5:05 PM

## 2022-04-19 ENCOUNTER — Ambulatory Visit: Payer: BC Managed Care – PPO | Admitting: Dermatology

## 2022-04-19 ENCOUNTER — Encounter: Payer: Self-pay | Admitting: Dermatology

## 2022-04-19 DIAGNOSIS — L570 Actinic keratosis: Secondary | ICD-10-CM

## 2022-04-19 DIAGNOSIS — B353 Tinea pedis: Secondary | ICD-10-CM

## 2022-04-19 DIAGNOSIS — Z1283 Encounter for screening for malignant neoplasm of skin: Secondary | ICD-10-CM

## 2022-04-19 DIAGNOSIS — Z85828 Personal history of other malignant neoplasm of skin: Secondary | ICD-10-CM | POA: Diagnosis not present

## 2022-04-19 DIAGNOSIS — L578 Other skin changes due to chronic exposure to nonionizing radiation: Secondary | ICD-10-CM

## 2022-04-19 DIAGNOSIS — L720 Epidermal cyst: Secondary | ICD-10-CM | POA: Diagnosis not present

## 2022-04-19 DIAGNOSIS — D2372 Other benign neoplasm of skin of left lower limb, including hip: Secondary | ICD-10-CM

## 2022-04-19 DIAGNOSIS — L814 Other melanin hyperpigmentation: Secondary | ICD-10-CM

## 2022-04-19 DIAGNOSIS — D1800 Hemangioma unspecified site: Secondary | ICD-10-CM

## 2022-04-19 DIAGNOSIS — D229 Melanocytic nevi, unspecified: Secondary | ICD-10-CM

## 2022-04-19 DIAGNOSIS — L821 Other seborrheic keratosis: Secondary | ICD-10-CM

## 2022-04-19 MED ORDER — CICLOPIROX OLAMINE 0.77 % EX CREA: TOPICAL_CREAM | Freq: Two times a day (BID) | CUTANEOUS | 2 refills | Status: DC

## 2022-04-19 NOTE — Progress Notes (Signed)
New Patient Visit  Subjective  Jordan Mann is a 66 y.o. female who presents for the following: Total body skin exam (Hx of BCC nose, L cheek, Mohs for both, no fhx of skin ca) and check spots (R cheek, 4-5 yrs hx of bx in past at [facility], pt said benign but would like removed/R cheek, 30m no symptoms).  The patient presents for Total-Body Skin Exam (TBSE) for skin cancer screening and mole check.  The patient has spots, moles and lesions to be evaluated, some may be new or changing and the patient has concerns that these could be cancer.   The following portions of the chart were reviewed this encounter and updated as appropriate:       Review of Systems:  No other skin or systemic complaints except as noted in HPI or Assessment and Plan.  Objective  Well appearing patient in no apparent distress; mood and affect are within normal limits.  A full examination was performed including scalp, head, eyes, ears, nose, lips, neck, chest, axillae, abdomen, back, buttocks, bilateral upper extremities, bilateral lower extremities, hands, feet, fingers, toes, fingernails, and toenails. All findings within normal limits unless otherwise noted below.  L nasal ala x 1 Pink scaly macules  R mid cheek x 2 Waxy tan patch and waxy flesh pap R mid cheek     spinal upper back SQ nodules  L plantar foot Scaling L plantar foot    Assessment & Plan   Lentigines - Scattered tan macules - Due to sun exposure - Benign-appearing, observe - Recommend daily broad spectrum sunscreen SPF 30+ to sun-exposed areas, reapply every 2 hours as needed. - Call for any changes - back  Seborrheic Keratoses - Stuck-on, waxy, tan-brown papules and/or plaques  - Benign-appearing - Discussed benign etiology and prognosis. - Observe - Call for any changes - back, legs  Melanocytic Nevi - Tan-brown and/or pink-flesh-colored symmetric macules and papules - Benign appearing on exam today -  Observation - Call clinic for new or changing moles - Recommend daily use of broad spectrum spf 30+ sunscreen to sun-exposed areas.  - chest, shoulder, arms  Hemangiomas - Red papules - Discussed benign nature - Observe - Call for any changes  Actinic Damage - Chronic condition, secondary to cumulative UV/sun exposure - diffuse scaly erythematous macules with underlying dyspigmentation - Recommend daily broad spectrum sunscreen SPF 30+ to sun-exposed areas, reapply every 2 hours as needed.  - Staying in the shade or wearing long sleeves, sun glasses (UVA+UVB protection) and wide brim hats (4-inch brim around the entire circumference of the hat) are also recommended for sun protection.  - Call for new or changing lesions.  Skin cancer screening performed today.   History of Basal Cell Carcinoma of the Skin - No evidence of recurrence today - Recommend regular full body skin exams - Recommend daily broad spectrum sunscreen SPF 30+ to sun-exposed areas, reapply every 2 hours as needed.  - Call if any new or changing lesions are noted between office visits  - nose, L cheek  Dermatofibroma - Firm pink/brown papulenodule with dimple sign - Benign appearing - Call for any changes  - L post thigh  AK (actinic keratosis) L nasal ala x 1  Destruction of lesion - L nasal ala x 1  Destruction method: cryotherapy   Informed consent: discussed and consent obtained   Lesion destroyed using liquid nitrogen: Yes   Region frozen until ice ball extended beyond lesion: Yes  Outcome: patient tolerated procedure well with no complications   Post-procedure details: wound care instructions given   Additional details:  Prior to procedure, discussed risks of blister formation, small wound, skin dyspigmentation, or rare scar following cryotherapy. Recommend Vaseline ointment to treated areas while healing.   Seborrheic keratosis R mid cheek x 2  Discussed cosmetic procedure (cryotherapy),  noncovered.  $60 for 1st lesion and $15 for each additional lesion if done on the same day.  Maximum charge $350.  One touch-up treatment included no charge. Discussed risks of treatment including dyspigmentation, small scar, and/or recurrence. Recommend daily broad spectrum sunscreen SPF 30+/photoprotection to treated areas once healed.   Destruction of lesion - R mid cheek x 2  Destruction method: cryotherapy   Informed consent: discussed and consent obtained   Lesion destroyed using liquid nitrogen: Yes   Region frozen until ice ball extended beyond lesion: Yes   Outcome: patient tolerated procedure well with no complications   Post-procedure details: wound care instructions given   Additional details:  Prior to procedure, discussed risks of blister formation, small wound, skin dyspigmentation, or rare scar following cryotherapy. Recommend Vaseline ointment to treated areas while healing.   Epidermal cyst spinal upper back  Benign-appearing. Exam most consistent with an epidermal inclusion cyst. Discussed that a cyst is a benign growth that can grow over time and sometimes get irritated or inflamed. Recommend observation if it is not bothersome. Discussed option of surgical excision to remove it if it is growing, symptomatic, or other changes noted. Please call for new or changing lesions so they can be evaluated.    Tinea pedis of left foot L plantar foot  Start Ciclopirox cr bid for 2-4wks  ciclopirox (LOPROX) 0.77 % cream - L plantar foot Apply topically 2 (two) times daily. Bid to left foot for 2-4 weeks until clear, then prn flares   Return for 2-54mfor cosmetic SKs f/u R cheek x 2, AK L nasal ala f/u.   I, SOthelia Pulling RMA, am acting as scribe for TBrendolyn Patty MD .  Documentation: I have reviewed the above documentation for accuracy and completeness, and I agree with the above.  TBrendolyn PattyMD

## 2022-04-19 NOTE — Patient Instructions (Addendum)

## 2022-06-18 ENCOUNTER — Ambulatory Visit
Admission: RE | Admit: 2022-06-18 | Discharge: 2022-06-18 | Disposition: A | Discharge status: Home / Self Care | Payer: BC Managed Care – PPO | Source: Ambulatory Visit | Attending: Advanced Practice Midwife | Admitting: Advanced Practice Midwife

## 2022-06-18 DIAGNOSIS — Z1239 Encounter for other screening for malignant neoplasm of breast: Secondary | ICD-10-CM | POA: Diagnosis present

## 2022-06-18 DIAGNOSIS — Z1231 Encounter for screening mammogram for malignant neoplasm of breast: Secondary | ICD-10-CM | POA: Diagnosis not present

## 2022-06-21 ENCOUNTER — Ambulatory Visit: Payer: BC Managed Care – PPO | Admitting: Dermatology

## 2022-06-21 DIAGNOSIS — L820 Inflamed seborrheic keratosis: Secondary | ICD-10-CM | POA: Diagnosis not present

## 2022-06-21 DIAGNOSIS — L814 Other melanin hyperpigmentation: Secondary | ICD-10-CM | POA: Diagnosis not present

## 2022-06-21 DIAGNOSIS — L821 Other seborrheic keratosis: Secondary | ICD-10-CM | POA: Diagnosis not present

## 2022-06-21 NOTE — Progress Notes (Signed)
   Follow-Up Visit   Subjective  Jordan Mann is a 66 y.o. female who presents for the following: Follow-up, SK f/u (R cheek, 77mf/u, clear per pt), Actinic Keratosis (L nasal ala, 230m/u), and check spot (Forehead, started itching ~2wks ago).    The following portions of the chart were reviewed this encounter and updated as appropriate:       Review of Systems:  No other skin or systemic complaints except as noted in HPI or Assessment and Plan.  Objective  Well appearing patient in no apparent distress; mood and affect are within normal limits.  A focused examination was performed including face. Relevant physical exam findings are noted in the Assessment and Plan.  R cheek x 1 Small residual stuck on waxy pap R cheek  mid forehead x 2 (2) Stuck on waxy paps with erythema    Assessment & Plan  Seborrheic keratosis R cheek x 1  Cosmetic SK f/u 1 free touch up today  Destruction of lesion - R cheek x 1  Destruction method: cryotherapy   Informed consent: discussed and consent obtained   Lesion destroyed using liquid nitrogen: Yes   Region frozen until ice ball extended beyond lesion: Yes   Outcome: patient tolerated procedure well with no complications   Post-procedure details: wound care instructions given   Additional details:  Prior to procedure, discussed risks of blister formation, small wound, skin dyspigmentation, or rare scar following cryotherapy. Recommend Vaseline ointment to treated areas while healing.   Inflamed seborrheic keratosis (2) mid forehead x 2  Symptomatic, irritating, patient would like treated.   Destruction of lesion - mid forehead x 2  Destruction method: cryotherapy   Informed consent: discussed and consent obtained   Lesion destroyed using liquid nitrogen: Yes   Region frozen until ice ball extended beyond lesion: Yes   Outcome: patient tolerated procedure well with no complications   Post-procedure details: wound care instructions  given   Additional details:  Prior to procedure, discussed risks of blister formation, small wound, skin dyspigmentation, or rare scar following cryotherapy. Recommend Vaseline ointment to treated areas while healing.   Lentigines - Scattered tan macules face  - Due to sun exposure - Benign-appearing, observe - Recommend daily broad spectrum sunscreen SPF 30+ to sun-exposed areas, reapply every 2 hours as needed. - Call for any changes   Return in about 10 months (around 04/22/2023) for TBSE, Hx of BCC, Hx of AKs.  I, SoOthelia PullingRMA, am acting as scribe for TaBrendolyn PattyMD .  Documentation: I have reviewed the above documentation for accuracy and completeness, and I agree with the above.  TaBrendolyn PattyD

## 2022-06-21 NOTE — Patient Instructions (Addendum)
Cryotherapy Aftercare  Wash gently with soap and water everyday.   Apply Vaseline and Band-Aid daily until healed.     Due to recent changes in healthcare laws, you may see results of your pathology and/or laboratory studies on MyChart before the doctors have had a chance to review them. We understand that in some cases there may be results that are confusing or concerning to you. Please understand that not all results are received at the same time and often the doctors may need to interpret multiple results in order to provide you with the best plan of care or course of treatment. Therefore, we ask that you please give us 2 business days to thoroughly review all your results before contacting the office for clarification. Should we see a critical lab result, you will be contacted sooner.   If You Need Anything After Your Visit  If you have any questions or concerns for your doctor, please call our main line at [phone] and press option 4 to reach your doctor's medical assistant. If no one answers, please leave a voicemail as directed and we will return your call as soon as possible. Messages left after 4 pm will be answered the following business day.   You may also send us a message via MyChart. We typically respond to MyChart messages within 1-2 business days.  For prescription refills, please ask your pharmacy to contact our office. Our fax number is [phone].  If you have an urgent issue when the clinic is closed that cannot wait until the next business day, you can page your doctor at the number below.    Please note that while we do our best to be available for urgent issues outside of office hours, we are not available 24/7.   If you have an urgent issue and are unable to reach us, you may choose to seek medical care at your doctor's office, retail clinic, urgent care center, or emergency room.  If you have a medical emergency, please immediately call 911 or go to the  emergency department.  Pager Numbers  - Dr. Kowalski: [phone]  - Dr. Moye: [phone]  - Dr. Stewart: [phone]  In the event of inclement weather, please call our main line at [phone] for an update on the status of any delays or closures.  Dermatology Medication Tips: Please keep the boxes that topical medications come in in order to help keep track of the instructions about where and how to use these. Pharmacies typically print the medication instructions only on the boxes and not directly on the medication tubes.   If your medication is too expensive, please contact our office at [phone] option 4 or send us a message through MyChart.   We are unable to tell what your co-pay for medications will be in advance as this is different depending on your insurance coverage. However, we may be able to find a substitute medication at lower cost or fill out paperwork to get insurance to cover a needed medication.   If a prior authorization is required to get your medication covered by your insurance company, please allow us 1-2 business days to complete this process.  Drug prices often vary depending on where the prescription is filled and some pharmacies may offer cheaper prices.  The website www.goodrx.com contains coupons for medications through different pharmacies. The prices here do not account for what the cost may be with help from insurance (it may be cheaper with your insurance), but the website can   give you the price if you did not use any insurance.  - You can print the associated coupon and take it with your prescription to the pharmacy.  - You may also stop by our office during regular business hours and pick up a GoodRx coupon card.  - If you need your prescription sent electronically to a different pharmacy, notify our office through [facility] MyChart or by phone at [phone] option 4.     Si Usted Necesita Algo Despus de Su Visita  Tambin puede  enviarnos un mensaje a travs de MyChart. Por lo general respondemos a los mensajes de MyChart en el transcurso de 1 a 2 das hbiles.  Para renovar recetas, por favor pida a su farmacia que se ponga en contacto con nuestra oficina. Nuestro nmero de fax es el [phone].  Si tiene un asunto urgente cuando la clnica est cerrada y que no puede esperar hasta el siguiente da hbil, puede llamar/localizar a su doctor(a) al nmero que aparece a continuacin.   Por favor, tenga en cuenta que aunque hacemos todo lo posible para estar disponibles para asuntos urgentes fuera del horario de oficina, no estamos disponibles las 24 horas del da, los 7 das de la semana.   Si tiene un problema urgente y no puede comunicarse con nosotros, puede optar por buscar atencin mdica  en el consultorio de su doctor(a), en una clnica privada, en un centro de atencin urgente o en una sala de emergencias.  Si tiene una emergencia mdica, por favor llame inmediatamente al 911 o vaya a la sala de emergencias.  Nmeros de bper  - Dr. Kowalski: [phone]  - Dra. Moye: [phone]  - Dra. Stewart: [phone]  En caso de inclemencias del tiempo, por favor llame a nuestra lnea principal al [phone] para una actualizacin sobre el estado de cualquier retraso o cierre.  Consejos para la medicacin en dermatologa: Por favor, guarde las cajas en las que vienen los medicamentos de uso tpico para ayudarle a seguir las instrucciones sobre dnde y cmo usarlos. Las farmacias generalmente imprimen las instrucciones del medicamento slo en las cajas y no directamente en los tubos del medicamento.   Si su medicamento es muy caro, por favor, pngase en contacto con nuestra oficina llamando al [phone] y presione la opcin 4 o envenos un mensaje a travs de MyChart.   No podemos decirle cul ser su copago por los medicamentos por adelantado ya que esto es diferente dependiendo de la cobertura de su seguro.  Sin embargo, es posible que podamos encontrar un medicamento sustituto a menor costo o llenar un formulario para que el seguro cubra el medicamento que se considera necesario.   Si se requiere una autorizacin previa para que su compaa de seguros cubra su medicamento, por favor permtanos de 1 a 2 das hbiles para completar este proceso.  Los precios de los medicamentos varan con frecuencia dependiendo del lugar de dnde se surte la receta y alguna farmacias pueden ofrecer precios ms baratos.  El sitio web www.goodrx.com tiene cupones para medicamentos de diferentes farmacias. Los precios aqu no tienen en cuenta lo que podra costar con la ayuda del seguro (puede ser ms barato con su seguro), pero el sitio web puede darle el precio si no utiliz ningn seguro.  - Puede imprimir el cupn correspondiente y llevarlo con su receta a la farmacia.  - Tambin puede pasar por nuestra oficina durante el horario de atencin regular y recoger una tarjeta de cupones de GoodRx.  -   Si necesita que su receta se enve electrnicamente a una farmacia diferente, informe a nuestra oficina a travs de MyChart de [facility] o por telfono llamando al [phone] y presione la opcin 4.  

## 2022-08-04 ENCOUNTER — Ambulatory Visit: Payer: BC Managed Care – PPO

## 2022-09-01 ENCOUNTER — Other Ambulatory Visit: Payer: Self-pay | Admitting: Otolaryngology

## 2022-09-01 DIAGNOSIS — D330 Benign neoplasm of brain, supratentorial: Secondary | ICD-10-CM

## 2022-09-21 ENCOUNTER — Ambulatory Visit
Admission: RE | Admit: 2022-09-21 | Discharge: 2022-09-21 | Disposition: A | Discharge status: Home / Self Care | Payer: Medicare Other | Source: Ambulatory Visit | Attending: Otolaryngology | Admitting: Otolaryngology

## 2022-09-21 DIAGNOSIS — D330 Benign neoplasm of brain, supratentorial: Secondary | ICD-10-CM

## 2022-09-21 MED ORDER — GADOPICLENOL 0.5 MMOL/ML IV SOLN
6.0000 mL | Freq: Once | INTRAVENOUS | Status: AC | PRN
  Administered 2022-09-21: 6 mL via INTRAVENOUS

## 2023-03-28 NOTE — Progress Notes (Unsigned)
ANNUAL PREVENTATIVE CARE GYNECOLOGY  ENCOUNTER NOTE  Subjective:       Jordan Mann is a 67 y.o. G20P3003 female here for a routine annual gynecologic exam. The patient {is/is not/has never been:13135} sexually active. The patient {is/is not:13135} taking hormone replacement therapy. {post-men bleed:13152::"Patient denies post-menopausal vaginal bleeding."} The patient wears seatbelts: {yes/no:311178}. The patient participates in regular exercise: {yes/no/not asked:9010}. Has the patient ever been transfused or tattooed?: {yes/no/not asked:9010}. The patient reports that there {is/is not:9024} domestic violence in her life.  Current complaints: 1.  ***    Gynecologic History No LMP recorded. Patient is postmenopausal. Contraception: post menopausal status and tubal ligation Last Pap: ***. Results were: {norm/abn:16337} Last mammogram: 06/18/2022. Results were: normal Last Colonoscopy: 03/05/2019: 10 years Last Dexa Scan:    Obstetric History OB History  Gravida Para Term Preterm AB Living  3 3 3     3   SAB IAB Ectopic Multiple Live Births          3    # Outcome Date GA Lbr Len/2nd Weight Sex Type Anes PTL Lv  3 Term 09/17/77   6 lb 8 oz (2.948 kg) M Vag-Spont   LIV  2 Term 04/15/74   8 lb 3 oz (3.714 kg) M Vag-Spont   LIV  1 Term 09/17/72   7 lb 5 oz (3.317 kg) M Vag-Spont   LIV    Past Medical History:  Diagnosis Date   Arthritis    Basal cell carcinoma    nose and L cheek txted in [place] with Mohs ~5 yrs ago   Fecal incontinence    Glaucoma    History of mammogram    Hypercholesteremia    Hyperthyroidism 1990   Graves disease -treated with PTU-now resolved.   Mild depression    Overactive bladder     Family History  Problem Relation Age of Onset   Diabetes type II Mother    Hypertension Mother    Cervical cancer Sister 24   Paranoid behavior Son    Breast cancer Neg Hx    Colon cancer Neg Hx    Ovarian cancer Neg Hx     Past Surgical History:   Procedure Laterality Date   BASAL CELL CARCINOMA EXCISION  10/2016   Moh's surgery   COLONOSCOPY  2010   CYSTOSCOPY  2015   ESOPHAGOGASTRODUODENOSCOPY  2011   LAPAROTOMY N/A 07/08/2019   Procedure: EXPLORATORY LAPAROTOMY possible bowel resection;  Surgeon: Duanne Guess, MD;  Location: [facility] OR[facility];  Service: General;  Laterality: N/A;   myringtomy     TUBAL LIGATION      Social History   Socioeconomic History   Marital status: Married    Spouse name: Not on file   Number of children: 3   Years of education: Not on file   Highest education level: Not on file  Occupational History   Occupation: Lexicographer houses    Employer: [employer], 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  Tobacco Use   Smoking status: Never   Smokeless tobacco: Never  Vaping Use   Vaping status: Never Used  Substance and Sexual Activity   Alcohol use: No   Drug use: No   Sexual activity: Yes    Birth control/protection: Post-menopausal, Surgical  Other Topics Concern   Not on file  Social History Narrative   Not on file   Social Determinants of Health   Financial Resource Strain: Patient Declined (02/08/2023)   Received [facility]   Overall Financial  Resource Strain (CARDIA)    Difficulty of Paying Living Expenses: Patient declined  Food Insecurity: Patient Declined (02/08/2023)   Received from [facility]   Hunger Vital Sign    Worried About Running Out of Food in the Last Year: Patient declined    Ran Out of Food in the Last Year: Patient declined  Transportation Needs: Patient Declined (02/08/2023)   Received from [facility] - [facility]    In the past 12 months, has lack of transportation kept you from medical appointments or from getting medications?: Patient declined    Lack of Transportation (Non-Medical): Patient declined  Physical Activity: Sufficiently Active (02/19/2019)   Exercise Vital Sign    Days of Exercise per Week: 4 days     Minutes of Exercise per Session: 40 min  Stress: Not on file  Social Connections: Not on file  Intimate Partner Violence: Not on file    Current Outpatient Medications on File Prior to Visit  Medication Sig Dispense Refill   azelastine (ASTELIN) 0.1 % nasal spray USE 2 SPRAYS IN EACH NOSTRIL EVERY 12 HOURS AS NEEDED FOR DRAINAGE     ciclopirox (LOPROX) 0.77 % cream Apply topically 2 (two) times daily. Bid to left foot for 2-4 weeks until clear, then prn flares 90 g 2   fexofenadine (ALLEGRA ALLERGY) 180 MG tablet      hydrochlorothiazide (HYDRODIURIL) 12.5 MG tablet Take by mouth.     ipratropium (ATROVENT) 0.03 % nasal spray USE 1 TO 2 SPRAYS IN EACH NOSTRIL THREE TIMES DAILY AS NEEDED FOR RUNNY NOSE     losartan (COZAAR) 25 MG tablet Take 1 tablet by mouth daily.     Magnesium 200 MG TABS      mirabegron ER (MYRBETRIQ) 50 MG TB24 tablet Take 50 mg by mouth.     mometasone (NASONEX) 50 MCG/ACT nasal spray SHAKE LIQUID WELL AND USE 2 SPRAYS IN EACH NOSTRIL EVERY DAY     polyethylene glycol (MIRALAX / GLYCOLAX) 17 g packet Take 17 g by mouth daily.     pravastatin (PRAVACHOL) 10 MG tablet Take 10 mg by mouth daily as needed.     ROCKLATAN 0.02-0.005 % SOLN INT 1 GTT IN OU QHS     SUMAtriptan (IMITREX) 50 MG tablet      No current facility-administered medications on file prior to visit.    Allergies  Allergen Reactions   Prednisone     Make her feel nauseous and "funny in the head"      Review of Systems ROS Review of Systems - General ROS: negative for - chills, fatigue, fever, hot flashes, night sweats, weight gain or weight loss Psychological ROS: negative for - anxiety, decreased libido, depression, mood swings, physical abuse or sexual abuse Ophthalmic ROS: negative for - blurry vision, eye pain or loss of vision ENT ROS: negative for - headaches, hearing change, visual changes or vocal changes Allergy and Immunology ROS: negative for - hives, itchy/watery eyes or seasonal  allergies Hematological and Lymphatic ROS: negative for - bleeding problems, bruising, swollen lymph nodes or weight loss Endocrine ROS: negative for - galactorrhea, hair pattern changes, hot flashes, malaise/lethargy, mood swings, palpitations, polydipsia/polyuria, skin changes, temperature intolerance or unexpected weight changes Breast ROS: negative for - new or changing breast lumps or nipple discharge Respiratory ROS: negative for - cough or shortness of breath Cardiovascular ROS: negative for - chest pain, irregular heartbeat, palpitations or shortness of breath Gastrointestinal ROS: no abdominal pain,  change in bowel habits, or black or bloody stools Genito-Urinary ROS: no dysuria, trouble voiding, or hematuria Musculoskeletal ROS: negative for - joint pain or joint stiffness Neurological ROS: negative for - bowel and bladder control changes Dermatological ROS: negative for rash and skin lesion changes   Objective:   There were no vitals taken for this visit. CONSTITUTIONAL: Well-developed, well-nourished female in no acute distress.  PSYCHIATRIC: Normal mood and affect. Normal behavior. Normal judgment and thought content. NEUROLGIC: Alert and oriented to person, place, and time. Normal muscle tone coordination. No cranial nerve deficit noted. HENT:  Normocephalic, atraumatic, External right and left ear normal. Oropharynx is clear and moist EYES: Conjunctivae and EOM are normal. Pupils are equal, round, and reactive to light. No scleral icterus.  NECK: Normal range of motion, supple, no masses.  Normal thyroid.  SKIN: Skin is warm and dry. No rash noted. Not diaphoretic. No erythema. No pallor. CARDIOVASCULAR: Normal heart rate noted, regular rhythm, no murmur. RESPIRATORY: Clear to auscultation bilaterally. Effort and breath sounds normal, no problems with respiration noted. BREASTS: Symmetric in size. No masses, skin changes, nipple drainage, or lymphadenopathy. ABDOMEN: Soft,  normal bowel sounds, no distention noted.  No tenderness, rebound or guarding.  BLADDER: Normal PELVIC:  Bladder {:311640}  Urethra: {:311719}  Vulva: {:311722}  Vagina: {:311643}  Cervix: {:311644}  Uterus: {:311718}  Adnexa: {:311645}  RV: {Blank multiple:19196::"External Exam NormaI","No Rectal Masses","Normal Sphincter tone"}  MUSCULOSKELETAL: Normal range of motion. No tenderness.  No cyanosis, clubbing, or edema.  2+ distal pulses. LYMPHATIC: No Axillary, Supraclavicular, or Inguinal Adenopathy.   Labs: Lab Results  Component Value Date   WBC 6.5 07/12/2019   HGB 7.8 (L) 07/12/2019   HCT 23.8 (L) 07/12/2019   MCV 93.7 07/12/2019   PLT 146 (L) 07/12/2019    Lab Results  Component Value Date   CREATININE 0.72 07/12/2019   BUN 15 07/12/2019   NA 143 07/12/2019   K 3.8 07/12/2019   CL 107 07/12/2019   CO2 27 07/12/2019    Lab Results  Component Value Date   ALT 13 07/09/2019   AST 25 07/09/2019   ALKPHOS 31 (L) 07/09/2019   BILITOT 0.4 07/09/2019    No results found for: "CHOL", "HDL", "LDLCALC", "LDLDIRECT", "TRIG", "CHOLHDL"  No results found for: "TSH"  No results found for: "HGBA1C"   Assessment:   1. Screening cholesterol level   2. Screening for diabetes mellitus (DM)      Plan:  Pap: Pap, Reflex if ASCUS Mammogram: Ordered Colon Screening:   UTD Labs:  Ordered Routine preventative health maintenance measures emphasized:  Self Breast Exams and Exercise/Diet/Weight control COVID Vaccination status: Return to Clinic - 1 Year   Hildred Laser, MD [facility]

## 2023-03-29 ENCOUNTER — Encounter: Payer: Self-pay | Admitting: Obstetrics and Gynecology

## 2023-03-29 ENCOUNTER — Ambulatory Visit (INDEPENDENT_AMBULATORY_CARE_PROVIDER_SITE_OTHER): Payer: Medicare Other | Admitting: Obstetrics and Gynecology

## 2023-03-29 VITALS — BP 129/61 | HR 72 | Resp 16 | Ht 60.0 in | Wt 133.2 lb

## 2023-03-29 DIAGNOSIS — M85859 Other specified disorders of bone density and structure, unspecified thigh: Secondary | ICD-10-CM

## 2023-03-29 DIAGNOSIS — Z131 Encounter for screening for diabetes mellitus: Secondary | ICD-10-CM

## 2023-03-29 DIAGNOSIS — Z01419 Encounter for gynecological examination (general) (routine) without abnormal findings: Secondary | ICD-10-CM | POA: Diagnosis not present

## 2023-03-29 DIAGNOSIS — Z13220 Encounter for screening for lipoid disorders: Secondary | ICD-10-CM

## 2023-03-29 DIAGNOSIS — Z8639 Personal history of other endocrine, nutritional and metabolic disease: Secondary | ICD-10-CM

## 2023-03-29 DIAGNOSIS — Z1231 Encounter for screening mammogram for malignant neoplasm of breast: Secondary | ICD-10-CM

## 2023-03-29 DIAGNOSIS — Z780 Asymptomatic menopausal state: Secondary | ICD-10-CM

## 2023-03-29 NOTE — Patient Instructions (Signed)
Preventive Care 65 Years and Older, Female Preventive care refers to lifestyle choices and visits with your health care provider that can promote health and wellness. Preventive care visits are also called wellness exams. What can I expect for my preventive care visit? Counseling Your health care provider may ask you questions about your: Medical history, including: Past medical problems. Family medical history. Pregnancy and menstrual history. History of falls. Current health, including: Memory and ability to understand (cognition). Emotional well-being. Home life and relationship well-being. Sexual activity and sexual health. Lifestyle, including: Alcohol, nicotine or tobacco, and drug use. Access to firearms. Diet, exercise, and sleep habits. Work and work environment. Sunscreen use. Safety issues such as seatbelt and bike helmet use. Physical exam Your health care provider will check your: Height and weight. These may be used to calculate your BMI (body mass index). BMI is a measurement that tells if you are at a healthy weight. Waist circumference. This measures the distance around your waistline. This measurement also tells if you are at a healthy weight and may help predict your risk of certain diseases, such as type 2 diabetes and high blood pressure. Heart rate and blood pressure. Body temperature. Skin for abnormal spots. What immunizations do I need?  Vaccines are usually given at various ages, according to a schedule. Your health care provider will recommend vaccines for you based on your age, medical history, and lifestyle or other factors, such as travel or where you work. What tests do I need? Screening Your health care provider may recommend screening tests for certain conditions. This may include: Lipid and cholesterol levels. Hepatitis C test. Hepatitis B test. HIV (human immunodeficiency virus) test. STI (sexually transmitted infection) testing, if you are at  risk. Lung cancer screening. Colorectal cancer screening. Diabetes screening. This is done by checking your blood sugar (glucose) after you have not eaten for a while (fasting). Mammogram. Talk with your health care provider about how often you should have regular mammograms. BRCA-related cancer screening. This may be done if you have a family history of breast, ovarian, tubal, or peritoneal cancers. Bone density scan. This is done to screen for osteoporosis. Talk with your health care provider about your test results, treatment options, and if necessary, the need for more tests. Follow these instructions at home: Eating and drinking  Eat a diet that includes fresh fruits and vegetables, whole grains, lean protein, and low-fat dairy products. Limit your intake of foods with high amounts of sugar, saturated fats, and salt. Take vitamin and mineral supplements as recommended by your health care provider. Do not drink alcohol if your health care provider tells you not to drink. If you drink alcohol: Limit how much you have to 0-1 drink a day. Know how much alcohol is in your drink. In the [place] one drink equals one 12 oz bottle of beer (355 mL), one 5 oz glass of wine (148 mL), or one 1 oz glass of hard liquor (44 mL). Lifestyle Brush your teeth every morning and night with fluoride toothpaste. Floss one time each day. Exercise for at least 30 minutes 5 or more days each week. Do not use any products that contain nicotine or tobacco. These products include cigarettes, chewing tobacco, and vaping devices, such as e-cigarettes. If you need help quitting, ask your health care provider. Do not use drugs. If you are sexually active, practice safe sex. Use a condom or other form of protection in order to prevent STIs. Take aspirin only as told by   your health care provider. Make sure that you understand how much to take and what form to take. Work with your health care provider to find out whether it  is safe and beneficial for you to take aspirin daily. Ask your health care provider if you need to take a cholesterol-lowering medicine (statin). Find healthy ways to manage stress, such as: Meditation, yoga, or listening to music. Journaling. Talking to a trusted person. Spending time with friends and family. Minimize exposure to UV radiation to reduce your risk of skin cancer. Safety Always wear your seat belt while driving or riding in a vehicle. Do not drive: If you have been drinking alcohol. Do not ride with someone who has been drinking. When you are tired or distracted. While texting. If you have been using any mind-altering substances or drugs. Wear a helmet and other protective equipment during sports activities. If you have firearms in your house, make sure you follow all gun safety procedures. What's next? Visit your health care provider once a year for an annual wellness visit. Ask your health care provider how often you should have your eyes and teeth checked. Stay up to date on all vaccines. This information is not intended to replace advice given to you by your health care provider. Make sure you discuss any questions you have with your health care provider. Document Revised: 02/04/2021 Document Reviewed: 02/04/2021 Elsevier Patient Education  2024 [employer] Breast Self-Awareness Breast self-awareness is knowing how your breasts look and feel. You need to: Check your breasts on a regular basis. Tell your doctor about any changes. Become familiar with the look and feel of your breasts. This can help you catch a breast problem while it is still small and can be treated. You should do breast self-exams even if you have breast implants. What you need: A mirror. A well-lit room. A pillow or other soft object. How to do a breast self-exam Follow these steps to do a breast self-exam: Look for changes  Take off all the clothes above your waist. Stand in front of a  mirror in a room with good lighting. Put your hands down at your sides. Compare your breasts in the mirror. Look for any difference between them, such as: A difference in shape. A difference in size. Wrinkles, dips, and bumps in one breast and not the other. Look at each breast for changes in the skin, such as: Redness. Scaly areas. Skin that has gotten thicker. Dimpling. Open sores (ulcers). Look for changes in your nipples, such as: Fluid coming out of a nipple. Fluid around a nipple. Bleeding. Dimpling. Redness. A nipple that looks pushed in (retracted), or that has changed position. Feel for changes Lie on your back. Feel each breast. To do this: Pick a breast to feel. Place a pillow under the shoulder closest to that breast. Put the arm closest to that breast behind your head. Feel the nipple area of that breast using the hand of your other arm. Feel the area with the pads of your three middle fingers by making small circles with your fingers. Use light, medium, and firm pressure. Continue the overlapping circles, moving downward over the breast. Keep making circles with your fingers. Stop when you feel your ribs. Start making circles with your fingers again, this time going upward until you reach your collarbone. Then, make circles outward across your breast and into your armpit area. Squeeze your nipple. Check for discharge and lumps. Repeat these steps to check your other   breast. Sit or stand in the tub or shower. With soapy water on your skin, feel each breast the same way you did when you were lying down. Write down what you find Writing down what you find can help you remember what to tell your doctor. Write down: What is normal for each breast. Any changes you find in each breast. These include: The kind of changes you find. A tender or painful breast. Any lump you find. Write down its size and where it is. When you last had your monthly period (menstrual  cycle). General tips If you are breastfeeding, the best time to check your breasts is after you feed your baby or after you use a breast pump. If you get monthly bleeding, the best time to check your breasts is 5-7 days after your monthly cycle ends. With time, you will become comfortable with the self-exam. You will also start to know if there are changes in your breasts. Contact a doctor if: You see a change in the shape or size of your breasts or nipples. You see a change in the skin of your breast or nipples, such as red or scaly skin. You have fluid coming from your nipples that is not normal. You find a new lump or thick area. You have breast pain. You have any concerns about your breast health. Summary Breast self-awareness includes looking for changes in your breasts and feeling for changes within your breasts. You should do breast self-awareness in front of a mirror in a well-lit room. If you get monthly periods (menstrual cycles), the best time to check your breasts is 5-7 days after your period ends. Tell your doctor about any changes you see in your breasts. Changes include changes in size, changes on the skin, painful or tender breasts, or fluid from your nipples that is not normal. This information is not intended to replace advice given to you by your health care provider. Make sure you discuss any questions you have with your health care provider. Document Revised: 01/14/2022 Document Reviewed: 06/11/2021 Elsevier Patient Education  2024 [employer] Inc[employer]  

## 2023-05-02 ENCOUNTER — Encounter: Payer: Self-pay | Admitting: Dermatology

## 2023-05-02 ENCOUNTER — Ambulatory Visit (INDEPENDENT_AMBULATORY_CARE_PROVIDER_SITE_OTHER): Payer: Medicare Other | Admitting: Dermatology

## 2023-05-02 VITALS — BP 121/69

## 2023-05-02 DIAGNOSIS — Z7189 Other specified counseling: Secondary | ICD-10-CM

## 2023-05-02 DIAGNOSIS — Z85828 Personal history of other malignant neoplasm of skin: Secondary | ICD-10-CM

## 2023-05-02 DIAGNOSIS — W908XXA Exposure to other nonionizing radiation, initial encounter: Secondary | ICD-10-CM | POA: Diagnosis not present

## 2023-05-02 DIAGNOSIS — L820 Inflamed seborrheic keratosis: Secondary | ICD-10-CM | POA: Diagnosis not present

## 2023-05-02 DIAGNOSIS — L578 Other skin changes due to chronic exposure to nonionizing radiation: Secondary | ICD-10-CM

## 2023-05-02 DIAGNOSIS — L818 Other specified disorders of pigmentation: Secondary | ICD-10-CM

## 2023-05-02 DIAGNOSIS — L814 Other melanin hyperpigmentation: Secondary | ICD-10-CM

## 2023-05-02 DIAGNOSIS — L729 Follicular cyst of the skin and subcutaneous tissue, unspecified: Secondary | ICD-10-CM

## 2023-05-02 DIAGNOSIS — D492 Neoplasm of unspecified behavior of bone, soft tissue, and skin: Secondary | ICD-10-CM

## 2023-05-02 DIAGNOSIS — D1801 Hemangioma of skin and subcutaneous tissue: Secondary | ICD-10-CM

## 2023-05-02 DIAGNOSIS — D2371 Other benign neoplasm of skin of right lower limb, including hip: Secondary | ICD-10-CM

## 2023-05-02 DIAGNOSIS — L821 Other seborrheic keratosis: Secondary | ICD-10-CM

## 2023-05-02 DIAGNOSIS — Z1283 Encounter for screening for malignant neoplasm of skin: Secondary | ICD-10-CM

## 2023-05-02 DIAGNOSIS — L720 Epidermal cyst: Secondary | ICD-10-CM | POA: Diagnosis not present

## 2023-05-02 DIAGNOSIS — D229 Melanocytic nevi, unspecified: Secondary | ICD-10-CM

## 2023-05-02 DIAGNOSIS — D2372 Other benign neoplasm of skin of left lower limb, including hip: Secondary | ICD-10-CM

## 2023-05-02 DIAGNOSIS — Z872 Personal history of diseases of the skin and subcutaneous tissue: Secondary | ICD-10-CM

## 2023-05-02 DIAGNOSIS — D239 Other benign neoplasm of skin, unspecified: Secondary | ICD-10-CM

## 2023-05-02 DIAGNOSIS — D2362 Other benign neoplasm of skin of left upper limb, including shoulder: Secondary | ICD-10-CM

## 2023-05-02 NOTE — Progress Notes (Signed)
Follow-Up Visit   Subjective  Jordan Mann is a 67 y.o. female who presents for the following: Skin Cancer Screening and Full Body Skin Exam, hx of BCCs, hx of Aks, check scaly spot chest, not sure how long it has been there, check spots face, some itchy, Cosmetic SK R cheek did not clear, has been frozen in the past and biopsied and keeps coming back.  The patient presents for Total-Body Skin Exam (TBSE) for skin cancer screening and mole check. The patient has spots, moles and lesions to be evaluated, some may be new or changing and the patient may have concern these could be cancer.    The following portions of the chart were reviewed this encounter and updated as appropriate: medications, allergies, medical history  Review of Systems:  No other skin or systemic complaints except as noted in HPI or Assessment and Plan.  Objective  Well appearing patient in no apparent distress; mood and affect are within normal limits.  A full examination was performed including scalp, head, eyes, ears, nose, lips, neck, chest, axillae, abdomen, back, buttocks, bilateral upper extremities, bilateral lower extremities, hands, feet, fingers, toes, fingernails, and toenails. All findings within normal limits unless otherwise noted below.   Relevant physical exam findings are noted in the Assessment and Plan.  R mid cheek 0.7 x 0.4cm tan macule     R temple x 1, R infraocular x 1 (2) Stuck on waxy paps with erythema  L oral commissure x 1 Firm white papule    Assessment & Plan   SKIN CANCER SCREENING PERFORMED TODAY.  ACTINIC DAMAGE - Chronic condition, secondary to cumulative UV/sun exposure - diffuse scaly erythematous macules with underlying dyspigmentation - Recommend daily broad spectrum sunscreen SPF 30+ to sun-exposed areas, reapply every 2 hours as needed.  - Staying in the shade or wearing long sleeves, sun glasses (UVA+UVB protection) and wide brim hats (4-inch brim around the  entire circumference of the hat) are also recommended for sun protection.  - Call for new or changing lesions.  LENTIGINES, SEBORRHEIC KERATOSES, HEMANGIOMAS - Benign normal skin lesions - Benign-appearing - Call for any changes  MELANOCYTIC NEVI - Tan-brown and/or pink-flesh-colored symmetric macules and papules - Benign appearing on exam today - Observation - Call clinic for new or changing moles - Recommend daily use of broad spectrum spf 30+ sunscreen to sun-exposed areas.   HISTORY OF BASAL CELL CARCINOMA OF THE SKIN - No evidence of recurrence today - Recommend regular full body skin exams - Recommend daily broad spectrum sunscreen SPF 30+ to sun-exposed areas, reapply every 2 hours as needed.  - Call if any new or changing lesions are noted between office visits  - nose, L cheek  EPIDERMAL INCLUSION CYST Exam: Subcutaneous nodule at spinal mid back  Benign-appearing. Exam most consistent with an epidermal inclusion cyst. Discussed that a cyst is a benign growth that can grow over time and sometimes get irritated or inflamed. Recommend observation if it is not bothersome. Discussed option of surgical excision to remove it if it is growing, symptomatic, or other changes noted. Please call for new or changing lesions so they can be evaluated.  HALO NEVUS L antecubitum Exam: 0.5cm pink flesh pap with surrounding hypopigmentation  Treatment Plan: Benign-appearing.  Observation.  Call clinic for new or changing moles.  Recommend daily use of broad spectrum spf 30+ sunscreen to sun-exposed areas.    DERMATOFIBROMA L lower leg, R post ankle Exam: Firm pink/brown papulenodule with dimple  sign. Treatment Plan: A dermatofibroma is a benign growth possibly related to trauma, such as an insect bite, cut from shaving, or inflamed acne-type bump.  Treatment options to remove include shave or excision with resulting scar and risk of recurrence.  Since benign-appearing and not bothersome,  will observe for now.    LENTIGINES Exam: scattered tan macules Due to sun exposure Treatment Plan: Benign-appearing, observe. Recommend daily broad spectrum sunscreen SPF 30+ to sun-exposed areas, reapply every 2 hours as needed.  Call for any changes  Counseling for BBL / IPL / Laser and Coordination of Care Discussed the treatment option of Broad Band Light (BBL) /Intense Pulsed Light (IPL)/ Laser for skin discoloration, including brown spots and redness.  Typically we recommend at least 1-3 treatment sessions about 5-8 weeks apart for best results.  Cannot have tanned skin when BBL performed, and regular use of sunscreen/photoprotection is advised after the procedure to help maintain results. The patient's condition may also require "maintenance treatments" in the future.  The fee for BBL / laser treatments is $350 per treatment session for the whole face.  A fee can be quoted for other parts of the body.  Insurance typically does not pay for BBL/laser treatments and therefore the fee is an out-of-pocket cost. Recommend prophylactic valtrex treatment. Once scheduled for procedure, will send Rx in prior to patient's appointment.    Neoplasm of skin R mid cheek  Epidermal / dermal shaving  Lesion diameter (cm):  0.7 Informed consent: discussed and consent obtained   Patient was prepped and draped in usual sterile fashion: area prepped with alcohol. 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  Destruction of lesion - R temple x 1, R infraocular x 1 (2)  Destruction method: cryotherapy   Informed consent: discussed and consent obtained   Lesion destroyed using liquid nitrogen: Yes   Region frozen until ice ball extended beyond lesion: Yes   Outcome: patient tolerated procedure well with no complications   Post-procedure details: wound care instructions given   Additional details:  Prior to procedure, discussed risks of blister formation, small wound, skin dyspigmentation, or rare scar following cryotherapy. Recommend Vaseline ointment to treated areas while healing.   Milia L oral commissure x 1  Symptomatic, irritating, patient would like treated.   Acne/Milia surgery - L oral commissure x 1 Procedure risks and benefits were discussed with the patient and verbal consent was obtained. Following prep of the skin on the L oral commissure with an alcohol swab and local anesthesia injection, extraction of milia was performed with a comedone extractor following superficial incision made over their surfaces with a #11 surgical blade. Capillary hemostasis was achieved with 20% aluminum chloride solution. Vaseline ointment was applied to each site. The patient tolerated the procedure well.   Return in about 1 year (around 05/01/2024) for Hx of BCC, Hx of AKs.  I, Sonya Hupman, RMA,  am acting as scribe for Willeen Niece, MD .   Documentation: I have reviewed the above documentation for accuracy and completeness, and I agree with the above.  Willeen Niece, MD

## 2023-05-02 NOTE — Patient Instructions (Addendum)

## 2023-05-06 LAB — SURGICAL PATHOLOGY

## 2023-05-10 ENCOUNTER — Telehealth: Payer: Self-pay

## 2023-05-10 NOTE — Telephone Encounter (Signed)
Advised patient biopsy of the right mid cheek was benign pigmented sun freckle, but had some atypical cells, if spot recurs, we will need to repeat the biopsy.

## 2023-05-10 NOTE — Telephone Encounter (Signed)
-----   Message from Willeen Niece sent at 05/10/2023  2:56 PM EDT ----- Skin , right mid cheek SOLAR LENTIGO, WITH SCATTERED ATYPICAL MELANOCYTES, SEE DESCRIPTION  Benign pigmented sun freckle, but had some atypical cells, if spot recurs, we will need to repeat the biopsy  - please call patient

## 2023-06-20 ENCOUNTER — Ambulatory Visit
Admission: RE | Admit: 2023-06-20 | Discharge: 2023-06-20 | Disposition: A | Discharge status: Home / Self Care | Payer: Medicare Other | Source: Ambulatory Visit | Attending: Obstetrics and Gynecology | Admitting: Obstetrics and Gynecology

## 2023-06-20 DIAGNOSIS — Z1231 Encounter for screening mammogram for malignant neoplasm of breast: Secondary | ICD-10-CM | POA: Diagnosis present

## 2024-03-28 NOTE — Progress Notes (Signed)
 ANNUAL PREVENTATIVE CARE GYNECOLOGY  ENCOUNTER NOTE  SUBJECTIVE:       Jordan Mann is a 68 y.o. G44P3003 female here for a routine annual gynecologic exam. The patient is not currently sexually active. The patient is not taking hormone replacement therapy. Patient denies post-menopausal vaginal bleeding. Family history of breast, uterine, ovarian cancer: no. The patient wears seatbelts: yes. The patient participates in regular exercise: yes. Has the patient ever been transfused or tattooed?: no. The patient reports that there is not domestic violence in her life. Has the patient completed the Gardasil vaccine? no.  Current complaints: 1.  none    Gynecologic History No LMP recorded. Patient is postmenopausal. Contraception: post menopausal status Last Pap: 02/19/19. Results were: normal History of abnormal pap: none History of STIs: none Last Mammogram: 06/20/23. Results were: normal Last Colonoscopy: 03/05/19 Last Dexa Scan: 09/22/21  PHQ-2:     04/05/2024    8:17 AM 03/29/2023    2:21 PM  Depression screen PHQ 2/9  Decreased Interest 0 0  Down, Depressed, Hopeless 0 0  PHQ - 2 Score 0 0    Obstetric History OB History  Gravida Para Term Preterm AB Living  3 3 3   3   SAB IAB Ectopic Multiple Live Births      3    # Outcome Date GA Lbr Len/2nd Weight Sex Type Anes PTL Lv  3 Term 09/17/77   6 lb 8 oz (2.948 kg) M Vag-Spont   LIV  2 Term 04/15/74   8 lb 3 oz (3.714 kg) M Vag-Spont   LIV  1 Term 09/17/72   7 lb 5 oz (3.317 kg) M Vag-Spont   LIV    Past Medical History:  Diagnosis Date   Actinic keratosis    Arthritis    Basal cell carcinoma    nose and L cheek txted in [place] with Mohs ~5 yrs ago   Fecal incontinence    Glaucoma    History of mammogram    Hypercholesteremia    Hyperthyroidism 1990   Graves disease -treated with PTU-now resolved.   Mild depression    Overactive bladder     Family History  Problem Relation Age of Onset   Diabetes type II  Mother    Hypertension Mother    Cervical cancer Sister 47   Paranoid behavior Son    Breast cancer Neg Hx    Colon cancer Neg Hx    Ovarian cancer Neg Hx     Past Surgical History:  Procedure Laterality Date   BASAL CELL CARCINOMA EXCISION  10/2016   Moh's surgery   COLONOSCOPY  2010   CYSTOSCOPY  2015   ESOPHAGOGASTRODUODENOSCOPY  2011   LAPAROTOMY N/A 07/08/2019   Procedure: EXPLORATORY LAPAROTOMY possible bowel resection;  Surgeon: Marolyn Nest, MD;  Location: [facility] ORS;  Service: General;  Laterality: N/A;   myringtomy     TUBAL LIGATION      Social History   Socioeconomic History   Marital status: Married    Spouse name: Not on file   Number of children: 3   Years of education: Not on file   Highest education level: Not on file  Occupational History   Occupation: Lexicographer houses    Employer: Production assistant, [employer]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  Tobacco Use   Smoking status: Never   Smokeless tobacco: Never  Vaping Use   Vaping status: Never Used  Substance and Sexual Activity   Alcohol use: No   Drug use:  No   Sexual activity: Yes    Birth control/protection: Post-menopausal, Surgical  Other Topics Concern   Not on file  Social History Narrative   Not on file   Social Drivers of Health   Financial Resource Strain: Low Risk  (02/06/2024)   Received from [facility]   Overall Financial Resource Strain (CARDIA)    Difficulty of Paying Living Expenses: Not hard at all  Food Insecurity: No Food Insecurity (02/06/2024)   Received from [facility]   Hunger Vital Sign    Within the past 12 months, you worried that your food would run out before you got the money to buy more.: Never true    Within the past 12 months, the food you bought just didn't last and you didn't have money to get more.: Never true  Transportation Needs: No Transportation Needs (02/06/2024)   Received from [facility] - Transportation    In the  past 12 months, has lack of transportation kept you from medical appointments or from getting medications?: No    Lack of Transportation (Non-Medical): No  Physical Activity: Sufficiently Active (02/19/2019)   Exercise Vital Sign    Days of Exercise per Week: 4 days    Minutes of Exercise per Session: 40 min  Stress: Not on file  Social Connections: Not on file  Intimate Partner Violence: Not on file    Current Outpatient Medications on File Prior to Visit  Medication Sig Dispense Refill   azelastine (ASTELIN) 0.1 % nasal spray USE 2 SPRAYS IN EACH NOSTRIL EVERY 12 HOURS AS NEEDED FOR DRAINAGE     fexofenadine (ALLEGRA ALLERGY) 180 MG tablet      hydrochlorothiazide (HYDRODIURIL) 12.5 MG tablet Take by mouth.     ipratropium (ATROVENT) 0.03 % nasal spray USE 1 TO 2 SPRAYS IN EACH NOSTRIL THREE TIMES DAILY AS NEEDED FOR RUNNY NOSE     losartan (COZAAR) 25 MG tablet Take 1 tablet by mouth daily.     Magnesium  200 MG TABS      polyethylene glycol (MIRALAX / GLYCOLAX) 17 g packet Take 17 g by mouth daily.     pravastatin (PRAVACHOL) 10 MG tablet Take 10 mg by mouth daily as needed.     SUMAtriptan (IMITREX) 50 MG tablet      brimonidine (ALPHAGAN) 0.2 % ophthalmic solution SMARTSIG:In Eye(s)     latanoprost  (XALATAN ) 0.005 % ophthalmic solution SMARTSIG:In Eye(s)     mometasone (NASONEX) 50 MCG/ACT nasal spray SHAKE LIQUID WELL AND USE 2 SPRAYS IN EACH NOSTRIL EVERY DAY     No current facility-administered medications on file prior to visit.    Allergies  Allergen Reactions   Prednisone     Make her feel nauseous and funny in the head     Review of Systems ROS Review of Systems - General ROS: negative for - chills, fatigue, fever, hot flashes, night sweats, weight gain or weight loss Psychological ROS: negative for - anxiety, decreased libido, depression, mood swings, physical abuse or sexual abuse Ophthalmic ROS: negative for - blurry vision, eye pain or loss of vision ENT ROS:  negative for - headaches, hearing change, visual changes or vocal changes Allergy and Immunology ROS: negative for - hives, itchy/watery eyes or seasonal allergies Hematological and Lymphatic ROS: negative for - bleeding problems, bruising, swollen lymph nodes or weight loss Endocrine ROS: negative for - galactorrhea, hair pattern changes, hot flashes, malaise/lethargy, mood swings, palpitations, polydipsia/polyuria, skin changes,  temperature intolerance or unexpected weight changes Breast ROS: negative for - new or changing breast lumps or nipple discharge Respiratory ROS: negative for - cough or shortness of breath Cardiovascular ROS: negative for - chest pain, irregular heartbeat, palpitations or shortness of breath Gastrointestinal ROS: no abdominal pain, change in bowel habits, or black or bloody stools Genito-Urinary ROS: no dysuria, trouble voiding, or hematuria Musculoskeletal ROS: negative for - joint pain or joint stiffness Neurological ROS: negative for - bowel and bladder control changes Dermatological ROS: negative for rash and skin lesion changes   OBJECTIVE:   BP (!) 128/50   Pulse (!) 59   Ht 5' (1.524 m)   Wt 125 lb (56.7 kg)   BMI 24.41 kg/m   CONSTITUTIONAL: Well-developed, well-nourished female in no acute distress.  PSYCHIATRIC: Normal mood and affect. Normal behavior. Normal judgment and thought content. NEUROLGIC: Alert and oriented to person, place, and time. Normal muscle tone coordination. No cranial nerve deficit noted. HENT:  Normocephalic, atraumatic, External right and left ear normal. Oropharynx is clear and moist EYES: Conjunctivae and EOM are normal. No scleral icterus.  NECK: Normal range of motion, supple, no masses.  Normal thyroid.  SKIN: Skin is warm and dry. No rash noted. Not diaphoretic. No erythema. No pallor. CARDIOVASCULAR: Normal heart rate noted, regular rhythm, no murmur. RESPIRATORY: Clear to auscultation bilaterally. Effort and breath  sounds normal, no problems with respiration noted. BREASTS: Symmetric in size. No masses, skin changes, nipple drainage, or lymphadenopathy. ABDOMEN: Soft, normal bowel sounds, no distention noted.  No tenderness, rebound or guarding.  PELVIC:  Bladder no bladder distension noted  Urethra: normal appearing urethra with no masses, tenderness or lesions  Vulva: normal appearing vulva with no masses, tenderness or lesions  Vagina: normal appearing vagina with normal color and discharge, no lesions  Cervix: normal appearing cervix without discharge or lesions  Uterus: uterus is normal size, shape, consistency and nontender  Adnexa: normal adnexa in size, nontender and no masses  RV: External Exam NormaI, No Rectal Masses, and Normal Sphincter tone  MUSCULOSKELETAL: Normal range of motion. No tenderness.  No cyanosis, clubbing, or edema.  2+ distal pulses. LYMPHATIC: No Axillary, Supraclavicular, or Inguinal Adenopathy.  Labs: Lab Results  Component Value Date   WBC 6.5 07/12/2019   HGB 7.8 (L) 07/12/2019   HCT 23.8 (L) 07/12/2019   MCV 93.7 07/12/2019   PLT 146 (L) 07/12/2019    Lab Results  Component Value Date   CREATININE 0.72 07/12/2019   BUN 15 07/12/2019   NA 143 07/12/2019   K 3.8 07/12/2019   CL 107 07/12/2019   CO2 27 07/12/2019    Lab Results  Component Value Date   ALT 13 07/09/2019   AST 25 07/09/2019   ALKPHOS 31 (L) 07/09/2019   BILITOT 0.4 07/09/2019    ASSESSMENT:   1. Encounter for well woman exam with routine gynecological exam   2. Breast cancer screening by mammogram   3. Cervical cancer screening   4. Osteoporosis screening      PLAN:   Jordan Mann is a 68 y.o. G40P3003 female here today for her annual exam, doing well.  Pap: done with cotesting today Mammogram: ordered due 06/19/24 Dexa: ordered  Colon: PCP; colonoscopy due 2030 Labs: PCP PHQ-2 = 0 Contraception: tubal Healthy lifestyle modifications discussed: multivitamin, diet,  exercise, sunscreen, tobacco and alcohol use. Emphasized importance of regular physical activity.  Calcium and Vit D recommendation reviewed.  All questions answered to patient's satisfaction.  Follow up 1 yr for annual, sooner prn.    Estil Mangle, DO [facility]

## 2024-03-28 NOTE — Patient Instructions (Signed)
 Preventive Care 43 Years and Older, Female Preventive care refers to lifestyle choices and visits with your health care provider that can promote health and wellness. Preventive care visits are also called wellness exams. What can I expect for my preventive care visit? Counseling Your health care provider may ask you questions about your: Medical history, including: Past medical problems. Family medical history. Pregnancy and menstrual history. History of falls. Current health, including: Memory and ability to understand (cognition). Emotional well-being. Home life and relationship well-being. Sexual activity and sexual health. Lifestyle, including: Alcohol, nicotine or tobacco, and drug use. Access to firearms. Diet, exercise, and sleep habits. Work and work Astronomer. Sunscreen use. Safety issues such as seatbelt and bike helmet use. Physical exam Your health care provider will check your: Height and weight. These may be used to calculate your BMI (body mass index). BMI is a measurement that tells if you are at a healthy weight. Waist circumference. This measures the distance around your waistline. This measurement also tells if you are at a healthy weight and may help predict your risk of certain diseases, such as type 2 diabetes and high blood pressure. Heart rate and blood pressure. Body temperature. Skin for abnormal spots. What immunizations do I need?  Vaccines are usually given at various ages, according to a schedule. Your health care provider will recommend vaccines for you based on your age, medical history, and lifestyle or other factors, such as travel or where you work. What tests do I need? Screening Your health care provider may recommend screening tests for certain conditions. This may include: Lipid and cholesterol levels. Hepatitis C test. Hepatitis B test. HIV (human immunodeficiency virus) test. STI (sexually transmitted infection) testing, if you are at  risk. Lung cancer screening. Colorectal cancer screening. Diabetes screening. This is done by checking your blood sugar (glucose) after you have not eaten for a while (fasting). Mammogram. Talk with your health care provider about how often you should have regular mammograms. BRCA-related cancer screening. This may be done if you have a family history of breast, ovarian, tubal, or peritoneal cancers. Bone density scan. This is done to screen for osteoporosis. Talk with your health care provider about your test results, treatment options, and if necessary, the need for more tests. Follow these instructions at home: Eating and drinking  Eat a diet that includes fresh fruits and vegetables, whole grains, lean protein, and low-fat dairy products. Limit your intake of foods with high amounts of sugar, saturated fats, and salt. Take vitamin and mineral supplements as recommended by your health care provider. Do not drink alcohol if your health care provider tells you not to drink. If you drink alcohol: Limit how much you have to 0-1 drink a day. Know how much alcohol is in your drink. In the [place] one drink equals one 12 oz bottle of beer (355 mL), one 5 oz glass of wine (148 mL), or one 1 oz glass of hard liquor (44 mL). Lifestyle Brush your teeth every morning and night with fluoride toothpaste. Floss one time each day. Exercise for at least 30 minutes 5 or more days each week. Do not use any products that contain nicotine or tobacco. These products include cigarettes, chewing tobacco, and vaping devices, such as e-cigarettes. If you need help quitting, ask your health care provider. Do not use drugs. If you are sexually active, practice safe sex. Use a condom or other form of protection in order to prevent STIs. Take aspirin only as told by  your health care provider. Make sure that you understand how much to take and what form to take. Work with your health care provider to find out whether it  is safe and beneficial for you to take aspirin daily. Ask your health care provider if you need to take a cholesterol-lowering medicine (statin). Find healthy ways to manage stress, such as: Meditation, yoga, or listening to music. Journaling. Talking to a trusted person. Spending time with friends and family. Minimize exposure to UV radiation to reduce your risk of skin cancer. Safety Always wear your seat belt while driving or riding in a vehicle. Do not drive: If you have been drinking alcohol. Do not ride with someone who has been drinking. When you are tired or distracted. While texting. If you have been using any mind-altering substances or drugs. Wear a helmet and other protective equipment during sports activities. If you have firearms in your house, make sure you follow all gun safety procedures. What's next? Visit your health care provider once a year for an annual wellness visit. Ask your health care provider how often you should have your eyes and teeth checked. Stay up to date on all vaccines. This information is not intended to replace advice given to you by your health care provider. Make sure you discuss any questions you have with your health care provider. Document Revised: 02/04/2021 Document Reviewed: 02/04/2021 Elsevier Patient Education  2024 [employer].

## 2024-04-05 ENCOUNTER — Other Ambulatory Visit (HOSPITAL_COMMUNITY)
Admission: RE | Admit: 2024-04-05 | Discharge: 2024-04-05 | Disposition: A | Discharge status: Home / Self Care | Source: Ambulatory Visit | Attending: Obstetrics | Admitting: Obstetrics

## 2024-04-05 ENCOUNTER — Encounter: Payer: Self-pay | Admitting: Obstetrics

## 2024-04-05 ENCOUNTER — Ambulatory Visit: Admitting: Obstetrics

## 2024-04-05 VITALS — BP 128/50 | HR 59 | Ht 60.0 in | Wt 125.0 lb

## 2024-04-05 DIAGNOSIS — Z124 Encounter for screening for malignant neoplasm of cervix: Secondary | ICD-10-CM | POA: Insufficient documentation

## 2024-04-05 DIAGNOSIS — Z13820 Encounter for screening for osteoporosis: Secondary | ICD-10-CM

## 2024-04-05 DIAGNOSIS — Z1231 Encounter for screening mammogram for malignant neoplasm of breast: Secondary | ICD-10-CM

## 2024-04-05 DIAGNOSIS — Z1331 Encounter for screening for depression: Secondary | ICD-10-CM | POA: Diagnosis not present

## 2024-04-05 DIAGNOSIS — Z01419 Encounter for gynecological examination (general) (routine) without abnormal findings: Secondary | ICD-10-CM | POA: Diagnosis present

## 2024-04-05 DIAGNOSIS — Z1151 Encounter for screening for human papillomavirus (HPV): Secondary | ICD-10-CM | POA: Diagnosis not present

## 2024-04-09 LAB — CYTOLOGY - PAP
Comment: NEGATIVE
Diagnosis: NEGATIVE
High risk HPV: NEGATIVE

## 2024-04-10 ENCOUNTER — Ambulatory Visit: Payer: Self-pay | Admitting: Obstetrics

## 2024-05-21 ENCOUNTER — Ambulatory Visit: Payer: Medicare Other | Admitting: Dermatology

## 2024-05-21 DIAGNOSIS — Z1283 Encounter for screening for malignant neoplasm of skin: Secondary | ICD-10-CM | POA: Diagnosis not present

## 2024-05-21 DIAGNOSIS — W908XXA Exposure to other nonionizing radiation, initial encounter: Secondary | ICD-10-CM

## 2024-05-21 DIAGNOSIS — D2262 Melanocytic nevi of left upper limb, including shoulder: Secondary | ICD-10-CM

## 2024-05-21 DIAGNOSIS — L821 Other seborrheic keratosis: Secondary | ICD-10-CM

## 2024-05-21 DIAGNOSIS — B353 Tinea pedis: Secondary | ICD-10-CM

## 2024-05-21 DIAGNOSIS — D229 Melanocytic nevi, unspecified: Secondary | ICD-10-CM

## 2024-05-21 DIAGNOSIS — D2372 Other benign neoplasm of skin of left lower limb, including hip: Secondary | ICD-10-CM

## 2024-05-21 DIAGNOSIS — D2361 Other benign neoplasm of skin of right upper limb, including shoulder: Secondary | ICD-10-CM

## 2024-05-21 DIAGNOSIS — Z85828 Personal history of other malignant neoplasm of skin: Secondary | ICD-10-CM

## 2024-05-21 DIAGNOSIS — L720 Epidermal cyst: Secondary | ICD-10-CM

## 2024-05-21 DIAGNOSIS — D489 Neoplasm of uncertain behavior, unspecified: Secondary | ICD-10-CM | POA: Diagnosis not present

## 2024-05-21 DIAGNOSIS — D0339 Melanoma in situ of other parts of face: Secondary | ICD-10-CM

## 2024-05-21 DIAGNOSIS — L729 Follicular cyst of the skin and subcutaneous tissue, unspecified: Secondary | ICD-10-CM

## 2024-05-21 DIAGNOSIS — D1801 Hemangioma of skin and subcutaneous tissue: Secondary | ICD-10-CM

## 2024-05-21 DIAGNOSIS — L578 Other skin changes due to chronic exposure to nonionizing radiation: Secondary | ICD-10-CM

## 2024-05-21 DIAGNOSIS — D239 Other benign neoplasm of skin, unspecified: Secondary | ICD-10-CM

## 2024-05-21 DIAGNOSIS — Z7189 Other specified counseling: Secondary | ICD-10-CM

## 2024-05-21 DIAGNOSIS — L814 Other melanin hyperpigmentation: Secondary | ICD-10-CM

## 2024-05-21 DIAGNOSIS — D039 Melanoma in situ, unspecified: Secondary | ICD-10-CM

## 2024-05-21 HISTORY — DX: Melanoma in situ, unspecified: D03.9

## 2024-05-21 MED ORDER — CICLOPIROX OLAMINE 0.77 % EX CREA: TOPICAL_CREAM | Freq: Two times a day (BID) | CUTANEOUS | 2 refills | Status: AC

## 2024-05-21 NOTE — Patient Instructions (Addendum)
 Biopsy Wound Care Instructions  Leave the original bandage on for 24 hours if possible.  If the bandage becomes soaked or soiled before that time, it is OK to remove it and examine the wound.  A small amount of post-operative bleeding is normal.  If excessive bleeding occurs, remove the bandage, place gauze over the site and apply continuous pressure (no peeking) over the area for 30 minutes. If this does not work, please call our clinic as soon as possible or page your doctor if it is after hours.   Once a day, cleanse the wound with soap and water. It is fine to shower. If a thick crust develops you may use a Q-tip dipped into dilute hydrogen peroxide (mix 1:1 with water) to dissolve it.  Hydrogen peroxide can slow the healing process, so use it only as needed.    After washing, apply petroleum jelly (Vaseline) or an antibiotic ointment if your doctor prescribed one for you, followed by a bandage.    For best healing, the wound should be covered with a layer of ointment at all times. If you are not able to keep the area covered with a bandage to hold the ointment in place, this may mean re-applying the ointment several times a day.  Continue this wound care until the wound has healed and is no longer open.   Itching and mild discomfort is normal during the healing process. However, if you develop pain or severe itching, please call our office.   If you have any discomfort, you can take Tylenol  (acetaminophen ) or ibuprofen as directed on the bottle. (Please do not take these if you have an allergy to them or cannot take them for another reason).  Some redness, tenderness and white or yellow material in the wound is normal healing.  If the area becomes very sore and red, or develops a thick yellow-green material (pus), it may be infected; please notify us .    If you have stitches, return to clinic as directed to have the stitches removed. You will continue wound care for 2-3 days after the  stitches are removed.   Wound healing continues for up to one year following surgery. It is not unusual to experience pain in the scar from time to time during the interval.  If the pain becomes severe or the scar thickens, you should notify the office.    A slight amount of redness in a scar is expected for the first six months.  After six months, the redness will fade and the scar will soften and fade.  The color difference becomes less noticeable with time.  If there are any problems, return for a post-op surgery check at your earliest convenience.  To improve the appearance of the scar, you can use silicone scar gel, cream, or sheets (such as Mederma or Serica) every night for up to one year. These are available over the counter (without a prescription).  Please call our office at [phone] for any questions or concerns.     Counseling for BBL / IPL / Laser and Coordination of Care Discussed the treatment option of Broad Band Light (BBL) /Intense Pulsed Light (IPL)/ Laser for skin discoloration, including brown spots and redness.  Typically we recommend at least 1-3 treatment sessions about 5-8 weeks apart for best results.  Cannot have tanned skin when BBL performed, and regular use of sunscreen/photoprotection is advised after the procedure to help maintain result  The fee for BBL / laser treatments is $  350 per treatment session for the whole face.  A fee can be quoted for other parts of the body.  Insurance typically does not pay for BBL/laser treatments and therefore the fee is an out-of-pocket     Melanoma ABCDEs  Melanoma is the most dangerous type of skin cancer, and is the leading cause of death from skin disease.  You are more likely to develop melanoma if you: Have light-colored skin, light-colored eyes, or red or blond hair Spend a lot of time in the sun Tan regularly, either outdoors or in a tanning bed Have had blistering sunburns, especially during childhood Have a  close family member who has had a melanoma Have atypical moles or large birthmarks  Early detection of melanoma is key since treatment is typically straightforward and cure rates are extremely high if we catch it early.   The first sign of melanoma is often a change in a mole or a new dark spot.  The ABCDE system is a way of remembering the signs of melanoma.  A for asymmetry:  The two halves do not match. B for border:  The edges of the growth are irregular. C for color:  A mixture of colors are present instead of an even brown color. D for diameter:  Melanomas are usually (but not always) greater than 6mm - the size of a pencil eraser. E for evolution:  The spot keeps changing in size, shape, and color.  Please check your skin once per month between visits. You can use a small mirror in front and a large mirror behind you to keep an eye on the back side or your body.   If you see any new or changing lesions before your next follow-up, please call to schedule a visit.  Please continue daily skin protection including broad spectrum sunscreen SPF 30+ to sun-exposed areas, reapplying every 2 hours as needed when you're outdoors.   Staying in the shade or wearing long sleeves, sun glasses (UVA+UVB protection) and wide brim hats (4-inch brim around the entire circumference of the hat) are also recommended for sun protection.     Due to recent changes in healthcare laws, you may see results of your pathology and/or laboratory studies on MyChart before the doctors have had a chance to review them. We understand that in some cases there may be results that are confusing or concerning to you. Please understand that not all results are received at the same time and often the doctors may need to interpret multiple results in order to provide you with the best plan of care or course of treatment. Therefore, we ask that you please give us  2 business days to thoroughly review all your results before  contacting the office for clarification. Should we see a critical lab result, you will be contacted sooner.   If You Need Anything After Your Visit  If you have any questions or concerns for your doctor, please call our main line at [phone] and press option 4 to reach your doctor's medical assistant. If no one answers, please leave a voicemail as directed and we will return your call as soon as possible. Messages left after 4 pm will be answered the following business day.   You may also send us  a message via MyChart. We typically respond to MyChart messages within 1-2 business days.  For prescription refills, please ask your pharmacy to contact our office. Our fax number is [phone].  If you have an urgent issue when the  clinic is closed that cannot wait until the next business day, you can page your doctor at the number below.    Please note that while we do our best to be available for urgent issues outside of office hours, we are not available 24/7.   If you have an urgent issue and are unable to reach us , you may choose to seek medical care at your doctor's office, retail clinic, urgent care center, or emergency room.  If you have a medical emergency, please immediately call 911 or go to the emergency department.  Pager Numbers  - Dr. Hester: [phone]  - Dr. Jackquline: [phone]  - Dr. Claudene: [phone]   - Dr. Raymund: [phone]  In the event of inclement weather, please call our main line at [phone] for an update on the status of any delays or closures.  Dermatology Medication Tips: Please keep the boxes that topical medications come in in order to help keep track of the instructions about where and how to use these. Pharmacies typically print the medication instructions only on the boxes and not directly on the medication tubes.   If your medication is too expensive, please contact our office at [phone] option 4 or send us  a message through  MyChart.   We are unable to tell what your co-pay for medications will be in advance as this is different depending on your insurance coverage. However, we may be able to find a substitute medication at lower cost or fill out paperwork to get insurance to cover a needed medication.   If a prior authorization is required to get your medication covered by your insurance company, please allow us  1-2 business days to complete this process.  Drug prices often vary depending on where the prescription is filled and some pharmacies may offer cheaper prices.  The website www.goodrx.com contains coupons for medications through different pharmacies. The prices here do not account for what the cost may be with help from insurance (it may be cheaper with your insurance), but the website can give you the price if you did not use any insurance.  - You can print the associated coupon and take it with your prescription to the pharmacy.  - You may also stop by our office during regular business hours and pick up a GoodRx coupon card.  - If you need your prescription sent electronically to a different pharmacy, notify our office through [facility] or by phone at [phone] option 4.     Si Usted Necesita Algo Despus de Su Visita  Tambin puede enviarnos un mensaje a travs de Clinical cytogeneticist. Por lo general respondemos a los mensajes de MyChart en el transcurso de 1 a 2 das hbiles.  Para renovar recetas, por favor pida a su farmacia que se ponga en contacto con nuestra oficina. Randi lakes de fax es [place] [phone].  Si tiene un asunto urgente cuando la clnica est cerrada y que no puede esperar hasta el siguiente da hbil, puede llamar/localizar a su doctor(a) al nmero que aparece a continuacin.   Por favor, tenga en cuenta que aunque hacemos todo lo posible para estar disponibles para asuntos urgentes fuera del horario de [place], no estamos disponibles las 24 horas del da, los 7 [address]  [address] de la  [place].   Si tiene un problema urgente y no puede comunicarse con nosotros, puede optar por buscar atencin mdica  en el consultorio de su doctor(a), en una clnica privada, en un centro de atencin urgente o en una sala  de emergencias.  Si tiene Engineer, drilling, por favor llame inmediatamente al 911 o vaya a la sala de emergencias.  Nmeros de bper  - Dr. Hester: [phone]  - Dra. Jackquline: [phone]  - Dr. Claudene: [phone]  - Dra. Kitts: [phone]  En caso de inclemencias del [place], por favor llame a nuestra lnea principal al [phone] para una actualizacin sobre el estado de cualquier retraso o cierre.  Consejos para la medicacin en dermatologa: Por favor, guarde las cajas en las que vienen los medicamentos de uso tpico para ayudarle a seguir las instrucciones sobre dnde y cmo usarlos. Las farmacias generalmente imprimen las instrucciones del medicamento slo en las cajas y no directamente en los tubos del [place].   Si su medicamento es muy caro, por favor, pngase en contacto con landry rieger llamando al [phone] y presione la opcin 4 o envenos un mensaje a travs de Clinical cytogeneticist.   No podemos decirle cul ser su copago por los medicamentos por adelantado ya que esto es diferente dependiendo de la cobertura de su seguro. Sin embargo, es posible que podamos encontrar un medicamento sustituto a Audiological scientist un formulario para que el seguro cubra el medicamento que se considera necesario.   Si se requiere una autorizacin previa para que su compaa de seguros malta su medicamento, por favor permtanos de 1 a 2 das hbiles para completar este proceso.  Los precios de los medicamentos varan con frecuencia dependiendo del Environmental consultant de dnde se surte la receta y alguna farmacias pueden ofrecer precios ms baratos.  El sitio web www.goodrx.com tiene cupones para medicamentos de Health and safety inspector. Los precios aqu no tienen en cuenta lo que  podra costar con la ayuda del seguro (puede ser ms barato con su seguro), pero el sitio web puede darle el precio si no utiliz Tourist information centre manager.  - Puede imprimir el cupn correspondiente y llevarlo con su receta a la farmacia.  - Tambin puede pasar por nuestra oficina durante el horario de atencin regular y Education officer, museum una tarjeta de cupones de GoodRx.  - Si necesita que su receta se enve electrnicamente a una farmacia diferente, informe a nuestra oficina a travs de MyChart de [place] o por telfono llamando al [phone] y presione la opcin 4.

## 2024-05-21 NOTE — Progress Notes (Signed)
 Follow-Up Visit   Subjective  Jordan Mann is a 68 y.o. female who presents for the following: Skin Cancer Screening and Full Body Skin Exam Hx of bcc, hx of aks and isks  Hx of solar lentigo with atypica bx proven at right mid cheek, has grown back Would like to discuss bbl for brown spots at face    The patient presents for Total-Body Skin Exam (TBSE) for skin cancer screening and mole check. The patient has spots, moles and lesions to be evaluated, some may be new or changing and the patient may have concern these could be cancer.    The following portions of the chart were reviewed this encounter and updated as appropriate: medications, allergies, medical history  Review of Systems:  No other skin or systemic complaints except as noted in HPI or Assessment and Plan.  Objective  Well appearing patient in no apparent distress; mood and affect are within normal limits.  A full examination was performed including scalp, head, eyes, ears, nose, lips, neck, chest, axillae, abdomen, back, buttocks, bilateral upper extremities, bilateral lower extremities, hands, feet, fingers, toes, fingernails, and toenails. All findings within normal limits unless otherwise noted below.   Relevant physical exam findings are noted in the Assessment and Plan.  right mid cheek 1.1 x 0.7 cm tan macule slight irregular tan pigmented macule at right mid cheek see previous biopsy and pathology    Assessment & Plan   SKIN CANCER SCREENING PERFORMED TODAY.  ACTINIC DAMAGE - Chronic condition, secondary to cumulative UV/sun exposure - diffuse scaly erythematous macules with underlying dyspigmentation - Recommend daily broad spectrum sunscreen SPF 30+ to sun-exposed areas, reapply every 2 hours as needed.  - Staying in the shade or wearing long sleeves, sun glasses (UVA+UVB protection) and wide brim hats (4-inch brim around the entire circumference of the hat) are also recommended for sun protection.  -  Call for new or changing lesions.  LENTIGINES, SEBORRHEIC KERATOSES, HEMANGIOMAS - Benign normal skin lesions - Benign-appearing - Call for any changes  LENTIGINES Exam: scattered tan macules face Due to sun exposure Treatment Plan: Benign-appearing, observe. Recommend daily broad spectrum sunscreen SPF 30+ to sun-exposed areas, reapply every 2 hours as needed.  Call for any changes   Counseling for BBL / IPL / Laser and Coordination of Care Discussed the treatment option of Broad Band Light (BBL) /Intense Pulsed Light (IPL)/ Laser for skin discoloration, including brown spots and redness.  Typically we recommend at least 1-3 treatment sessions about 5-8 weeks apart for best results.  Cannot have tanned skin when BBL performed, and regular use of sunscreen/photoprotection is advised after the procedure to help maintain results. The patient's condition may also require maintenance treatments in the future.  The fee for BBL / laser treatments is $350 per treatment session for the whole face.  A fee can be quoted for other parts of the body.  Insurance typically does not pay for BBL/laser treatments and therefore the fee is an out-of-pocket cost. Recommend prophylactic valtrex treatment. Once scheduled for procedure, will send Rx in prior to patient's appointment.    MELANOCYTIC NEVI - Tan-brown and/or pink-flesh-colored symmetric macules and papules - Benign appearing on exam today - Observation - Call clinic for new or changing moles - Recommend daily use of broad spectrum spf 30+ sunscreen to sun-exposed areas.   HALO NEVUS L antecubitum Exam: 0.5cm pink flesh papule with surrounding hypopigmentation   Treatment Plan: Benign-appearing. Stable. Observation.  Call clinic for new  or changing moles.  Recommend daily use of broad spectrum spf 30+ sunscreen to sun-exposed areas.     DERMATOFIBROMA L lower leg, left thigh, R post ankle (vs porokeratosis) Exam: Firm pink/brown papulenodule  with dimple sign.  Treatment Plan: A dermatofibroma is a benign growth possibly related to trauma, such as an insect bite, cut from shaving, or inflamed acne-type bump.  Treatment options to remove include shave or excision with resulting scar and risk of recurrence.  Since benign-appearing and not bothersome, will observe for now.     EPIDERMAL INCLUSION CYST Exam: Subcutaneous nodule at spinal mid back   Benign-appearing. Exam most consistent with an epidermal inclusion cyst. Discussed that a cyst is a benign growth that can grow over time and sometimes get irritated or inflamed. Recommend observation if it is not bothersome. Discussed option of surgical excision to remove it if it is growing, symptomatic, or other changes noted. 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  - Call if any new or changing lesions are noted between office visits    NEOPLASM OF UNCERTAIN BEHAVIOR right mid cheek Epidermal / dermal shaving  Lesion diameter (cm):  1.2 Informed consent: discussed and consent obtained   Patient was prepped and draped in usual sterile fashion: Area prepped with alcohol. Anesthesia: the lesion was anesthetized in a standard fashion   Anesthetic:  1% lidocaine  w/ epinephrine 1-100,000 buffered w/ 8.4% NaHCO3 Instrument used: flexible razor blade   Hemostasis achieved with: pressure, aluminum chloride and electrodesiccation   Outcome: patient tolerated procedure well   Post-procedure details: wound care  instructions given   Post-procedure details comment:  Ointment and small bandage applied.   Specimen 1 - Surgical pathology Differential Diagnosis: Recurrent Lentigo r/o atypia  Refer to previous pathology  Patient: [name], PAPANIA Collected: 05/02/2023 Client: [facility] Accession: [id number] Check Margins: yes Recurrent Lentigo r/o atypia   Refer to previous pathology  Patient: LATTIE, RIEGE Collected: 05/02/2023 Client: [facility] Accession: [id number] TINEA PEDIS OF LEFT FOOT   Related Medications ciclopirox  (LOPROX ) 0.77 % cream Apply topically 2 (two) times daily. Bid to left foot for 2-4 weeks until clear, then prn flares Return in about 1 year (around 05/21/2025) for TBSE.  I, Eleanor Blush, CMA, am acting as scribe for Rexene Rattler, MD.   Documentation: I have reviewed the above documentation for accuracy and completeness, and I agree with the above.  Rexene Rattler, MD

## 2024-05-23 LAB — SURGICAL PATHOLOGY

## 2024-05-28 ENCOUNTER — Ambulatory Visit: Payer: Self-pay | Admitting: Dermatology

## 2024-05-28 ENCOUNTER — Encounter: Payer: Self-pay | Admitting: Dermatology

## 2024-05-28 ENCOUNTER — Other Ambulatory Visit: Payer: Self-pay

## 2024-05-28 DIAGNOSIS — D0330 Melanoma in situ of unspecified part of face: Secondary | ICD-10-CM

## 2024-05-28 NOTE — Telephone Encounter (Signed)
 Advised pt referral placed to Dr. Paci for mohs for melanoma IS of R mid cheek.  Scheduled pt for 13m TBSE with Dr. Jackquline.  Advised pt we needed to cancel her BBL appointment and Dr. Jackquline would discuss with her in future after melanoma in situ treated./sh

## 2024-05-28 NOTE — Progress Notes (Signed)
 Referral to Dr. Paci

## 2024-05-30 ENCOUNTER — Ambulatory Visit (INDEPENDENT_AMBULATORY_CARE_PROVIDER_SITE_OTHER): Admitting: Dermatology

## 2024-05-30 ENCOUNTER — Encounter: Payer: Self-pay | Admitting: Dermatology

## 2024-05-30 VITALS — BP 129/69 | HR 60 | Temp 97.9°F

## 2024-05-30 DIAGNOSIS — L814 Other melanin hyperpigmentation: Secondary | ICD-10-CM | POA: Diagnosis not present

## 2024-05-30 DIAGNOSIS — D0339 Melanoma in situ of other parts of face: Secondary | ICD-10-CM

## 2024-05-30 DIAGNOSIS — C439 Malignant melanoma of skin, unspecified: Secondary | ICD-10-CM | POA: Insufficient documentation

## 2024-05-30 DIAGNOSIS — L578 Other skin changes due to chronic exposure to nonionizing radiation: Secondary | ICD-10-CM

## 2024-05-30 NOTE — Patient Instructions (Signed)

## 2024-05-30 NOTE — Progress Notes (Signed)
 Follow-Up Visit   Subjective  Jordan Mann is a 68 y.o. 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 Jackquline.  The following portions of the chart were reviewed this encounter and updated as appropriate: medications, allergies, medical history  Review of Systems:  No other skin or systemic complaints except as noted in HPI or Assessment and Plan.  Objective  Well appearing patient in no apparent distress; mood and affect are within normal limits.  A focused examination was performed of the following areas: Right mid cheek Relevant physical exam findings are noted in the Assessment and Plan.   Right Buccal Cheek Atrophic plaque with adjacent pigment   Assessment & Plan   MALIGNANT MELANOMA OF SKIN (HCC) Right Buccal Cheek Mohs surgery  Consent obtained: written  Anticoagulation: Was the anticoagulation regimen changed prior to Mohs? No    Anesthesia: Anesthesia method: local infiltration Local anesthetic: lidocaine  1% WITH epi  Procedure Details: Timeout: pre-procedure verification complete Procedure Prep: patient was prepped and draped in usual sterile fashion Prep type: chlorhexidine  Biopsy accession number: [id number] Pre-Op diagnosis: melanoma Melanoma subtype: in situ Melanoma histologic subtype: lentigo maligna MohsAIQ Surgical site (if tumor spans multiple areas, please select predominant area): cheek (including jawline) Surgery side: right Surgical site (from skin exam): Right Buccal Cheek Pre-operative length (cm): 1.7 Pre-operative width (cm): 1.5 Indications for Mohs surgery: anatomic location where tissue conservation is critical, ill-defined borders and recurrence  Micrographic Surgery Details: Post-operative length (cm): 2.8 Post-operative width (cm): 3.2 Number of Mohs stages: 2 Post surgery depth of defect: subcutaneous fat  Stage 1    Tumor features identified on  Mohs section: melanoma  Stage 2    Tumor features identified on Mohs section: no tumor identified  Reconstruction: Was the defect reconstructed? Yes   Was reconstruction performed by the same Mohs surgeon? Yes   Setting of reconstruction: outpatient office When was reconstruction performed? same day Type of reconstruction: linear Linear reconstruction: complex  Skin repair Complexity:  Complex Final length (cm):  7.8 Informed consent: discussed and consent obtained   Timeout: patient name, date of birth, surgical site, and procedure verified   Procedure prep:  Patient was prepped and draped in usual sterile fashion Prep type:  Chlorhexidine  Anesthesia: the lesion was anesthetized in a standard fashion   Anesthetic:  1% lidocaine  w/ epinephrine 1-100,000 buffered w/ 8.4% NaHCO3 Reason for type of repair: reduce tension to allow closure, reduce subcutaneous dead space and avoid a hematoma, allow closure of the large defect and preserve normal anatomy   Undermining: area extensively undermined   Subcutaneous layers (deep stitches):  Suture size:  4-0 Suture type: Monocryl (poliglecaprone 25)   Stitches:  Buried vertical mattress Fine/surface layer approximation (top stitches):  Suture size:  6-0 Suture type: fast-absorbing plain gut   Stitches: simple interrupted   Hemostasis achieved with: pressure and electrodesiccation Outcome: patient tolerated procedure well with no complications   Post-procedure details: sterile dressing applied and wound care instructions given   Dressing type: bandage and pressure dressing      Return in about 4 weeks (around 06/27/2024) for wound check.  Jordan Mann, CMA, am acting as scribe for Jordan CHRISTELLA HOLY, MD.    05/30/2024  HISTORY OF PRESENT ILLNESS  Jordan Mann is seen in consultation at the request of Dr. Jackquline for biopsy-proven Melanoma in Situ/Lentigo Maligna of the right cheek. They note that the area has been  present for about 5  years increasing in size with time.  There is no history of previous treatment.  Reports no other new or changing lesions and has no other complaints today.  Medications and allergies: see patient chart.  Review of systems: Reviewed 8 systems and notable for the above skin cancer.  All other systems reviewed are unremarkable/negative, unless noted in the HPI. Past medical history, surgical history, family history, social history were also reviewed and are noted in the chart/questionnaire.    PHYSICAL EXAMINATION  General: Well-appearing, in no acute distress, alert and oriented x 4. Vitals reviewed in chart (if available).   Skin: Exam reveals a 1.7 x 1.5 cm erythematous papule and biopsy scar on the right cheek. There are rhytids, telangiectasias, and lentigines, consistent with photodamage.  Biopsy report(s) reviewed, confirming the diagnosis.   ASSESSMENT  1) Melanoma in Situ/Lentigo Maligna of the right cheek 2) photodamage 3) solar lentigines   PLAN   1. Due to location, size, histology, or recurrence and the likelihood of subclinical extension as well as the need to conserve normal surrounding tissue, the patient was deemed acceptable for Mohs micrographic surgery (MMS).  The nature and purpose of the procedure, associated benefits and risks including recurrence and scarring, possible complications such as pain, infection, and bleeding, and alternative methods of treatment if appropriate were discussed with the patient during consent. The lesion location was verified by the patient, by reviewing previous notes, pathology reports, and by photographs as well as angulation measurements if available.  Informed consent was reviewed and signed by the patient, and timeout was performed at 8:15 AM. See op note below.  2. For the photodamage and solar lentigines, sun protection discussed/information given on OTC sunscreens, and we recommend continued regular follow-up with primary dermatologist  every 6 months or sooner for any growing, bleeding, or changing lesions. 3. Prognosis and future surveillance discussed. 4. Letter with treatment outcome sent to referring provider. 5. Pain acetaminophen /ibuprofen   MOHS MICROGRAPHIC SURGERY AND RECONSTRUCTION  Initial size:   1.7 x 1.5 cm Surgical defect/wound size: 2.8 x 3.2 cm Anesthesia:    0.33% lidocaine  with 1:200,000 epinephrine EBL:    <5 mL Complications:  None Repair type:   Complex SQ suture:   4-0 Monocryl Cutaneous suture:  6-0 Plain gut Final size of the repair: 7.8 cm  Stages: 2  STAGE I: Anesthesia achieved with 0.5% lidocaine  with 1:200,000 epinephrine. ChloraPrep applied. 5 section(s) excised using Mohs technique (this includes total peripheral and deep tissue margin excision and evaluation with frozen sections, excised and interpreted by the same physician). The tumor was first debulked and then excised with an approx. 2 mm margin.  Hemostasis was achieved with electrocautery as needed.  The specimen was then oriented, subdivided/relaxed, inked, and processed using Mohs technique. Tissue was stained with H&E and MART-1 with 2 chromogens (2 immunostains).   Frozen section analysis revealed a positive margin for atypical proliferation of melanocytes arranged in a confluent and pagetoid pattern along the basal and suprabasal layers. The melanocytes exhibit nuclear pleomorphism, hyperchromasia, and prominent nucleoli. There is an increased number of melanocytes at the dermoepidermal junction with focal nesting and single-cell spread. No evidence of dermal invasion is seen in the peripheral margin.    STAGE II: An additional 2 mm margin was excised.  Hemostasis was achieved with electrocautery as needed.  The specimen was then oriented, subdivided/relaxed, inked, and processed using Mohs technique. Evaluation of slides by the Mohs surgeon revealed clear tumor margins.  Tissue was stained with H&E and MART-1 with 2 chromogens (2  immunostains).   Reconstruction  The surgical wound was then cleaned, prepped, and re-anesthetized as above. Wound edges were undermined extensively along at least one entire edge and at a distance equal to or greater than the width of the defect (see wound defect size above) in order to achieve closure and decrease wound tension and anatomic distortion. Redundant tissue repair including standing cone removal was performed. Hemostasis was achieved with electrocautery. Subcutaneous and epidermal tissues were approximated with the above sutures. The surgical site was then lightly scrubbed with sterile, saline-soaked gauze. The area was then bandaged using Vaseline ointment, non-adherent gauze, gauze pads, and tape to provide an adequate pressure dressing. The patient tolerated the procedure well, was given detailed written and verbal wound care instructions, and was discharged in good condition.   The patient will follow-up: 4 weeks.    Documentation: I have reviewed the above documentation for accuracy and completeness, and I agree with the above.  Jordan CHRISTELLA HOLY, MD

## 2024-06-11 ENCOUNTER — Encounter: Payer: Self-pay | Admitting: Dermatology

## 2024-06-20 ENCOUNTER — Ambulatory Visit
Admission: RE | Admit: 2024-06-20 | Discharge: 2024-06-20 | Disposition: A | Discharge status: Home / Self Care | Source: Ambulatory Visit | Attending: Obstetrics | Admitting: Obstetrics

## 2024-06-20 DIAGNOSIS — Z1231 Encounter for screening mammogram for malignant neoplasm of breast: Secondary | ICD-10-CM | POA: Insufficient documentation

## 2024-06-20 DIAGNOSIS — Z13820 Encounter for screening for osteoporosis: Secondary | ICD-10-CM | POA: Insufficient documentation

## 2024-06-20 DIAGNOSIS — M8588 Other specified disorders of bone density and structure, other site: Secondary | ICD-10-CM | POA: Diagnosis not present

## 2024-06-20 DIAGNOSIS — Z780 Asymptomatic menopausal state: Secondary | ICD-10-CM | POA: Insufficient documentation

## 2024-06-28 ENCOUNTER — Ambulatory Visit (INDEPENDENT_AMBULATORY_CARE_PROVIDER_SITE_OTHER): Admitting: Dermatology

## 2024-06-28 ENCOUNTER — Encounter: Payer: Self-pay | Admitting: Dermatology

## 2024-06-28 VITALS — BP 129/68 | HR 61

## 2024-06-28 DIAGNOSIS — D0330 Melanoma in situ of unspecified part of face: Secondary | ICD-10-CM

## 2024-06-28 DIAGNOSIS — L905 Scar conditions and fibrosis of skin: Secondary | ICD-10-CM | POA: Diagnosis not present

## 2024-06-28 DIAGNOSIS — Z86006 Personal history of melanoma in-situ: Secondary | ICD-10-CM | POA: Diagnosis not present

## 2024-06-28 NOTE — Progress Notes (Unsigned)
   Follow Up Visit   Subjective  MALAN WERK is a 68 y.o. female who presents for the following: follow up from Mohs surgery   The patient presents for follow up from Mohs surgery for a MIS on the right buccal cheek, treated on 05/30/24, repaired with linear closure. The patient has been bandaging the wound as directed. The endorse the following concerns: none  The following portions of the chart were reviewed this encounter and updated as appropriate: medications, allergies, medical history  Review of Systems:  No other skin or systemic complaints except as noted in HPI or Assessment and Plan.  Objective  Well appearing patient in no apparent distress; mood and affect are within normal limits.  A focal examination was performed including scalp, head, face. All findings within normal limits unless otherwise noted below.  Healing wound with mild erythema   Relevant physical exam findings are noted in the Assessment and Plan.    Assessment & Plan   Scar s/p Mohs for MIS on the right cheek, treated on 05/30/24, repaired with linear closure - Reassured that wound is healing well - No evidence of infection - No swelling, induration, purulence, dehiscence, or tenderness out of proportion to the clinical exam, see photo above - Discussed that scars take up to 12 months to mature from the date of surgery - Recommend SPF 30+ to scar daily to prevent purple color from UV exposure during scar maturation process - Discussed that erythema and raised appearance of scar will fade over the next 4-6 months - OK to start scar massage at 4-6 weeks post-op - Can consider silicone based products for scar healing starting at 6 weeks post-op  HISTORY OF MELANOMA IN SITU - No evidence of recurrence today - Recommend regular full body skin exams - Recommend daily broad spectrum sunscreen SPF 30+ to sun-exposed areas, reapply every 2 hours as needed.  - Call if any new or changing lesions are noted  between office visits   Return if symptoms worsen or fail to improve, for every 4-6 months for TBSE Gen Derm.  I, [name], CMA, am acting as scribe for RUFUS CHRISTELLA HOLY, MD.   Documentation: I have reviewed the above documentation for accuracy and completeness, and I agree with the above.  RUFUS CHRISTELLA HOLY, MD

## 2024-06-28 NOTE — Patient Instructions (Addendum)

## 2024-09-10 ENCOUNTER — Ambulatory Visit: Admitting: Dermatology

## 2024-11-26 ENCOUNTER — Ambulatory Visit: Admitting: Dermatology

## 2025-05-21 ENCOUNTER — Encounter: Admitting: Dermatology
